# Patient Record
Sex: Female | Born: 1970 | Race: White | Hispanic: Yes | Marital: Married | Smoking: Never smoker
Health system: Southern US, Community
[De-identification: ages and names within clinical notes are randomized; demographics above are authoritative.]

## PROBLEM LIST (undated history)

## (undated) DIAGNOSIS — J3089 Other allergic rhinitis: Secondary | ICD-10-CM

## (undated) DIAGNOSIS — G43909 Migraine, unspecified, not intractable, without status migrainosus: Secondary | ICD-10-CM

## (undated) DIAGNOSIS — G44229 Chronic tension-type headache, not intractable: Secondary | ICD-10-CM

## (undated) DIAGNOSIS — R51 Headache: Secondary | ICD-10-CM

## (undated) DIAGNOSIS — M542 Cervicalgia: Secondary | ICD-10-CM

## (undated) HISTORY — DX: Other allergic rhinitis: J30.89

## (undated) HISTORY — DX: Headache: R51

## (undated) HISTORY — DX: Chronic tension-type headache, not intractable: G44.229

## (undated) HISTORY — DX: Cervicalgia: M54.2

## (undated) HISTORY — DX: Migraine, unspecified, not intractable, without status migrainosus: G43.909

---

## 2007-04-27 HISTORY — PX: APPENDECTOMY: SHX54

## 2015-04-27 DIAGNOSIS — G44229 Chronic tension-type headache, not intractable: Secondary | ICD-10-CM

## 2015-04-27 DIAGNOSIS — R519 Headache, unspecified: Secondary | ICD-10-CM

## 2015-04-27 DIAGNOSIS — M542 Cervicalgia: Secondary | ICD-10-CM

## 2015-04-27 HISTORY — DX: Chronic tension-type headache, not intractable: G44.229

## 2015-04-27 HISTORY — DX: Cervicalgia: M54.2

## 2015-04-27 HISTORY — DX: Headache, unspecified: R51.9

## 2015-08-21 ENCOUNTER — Encounter: Payer: Self-pay | Admitting: Internal Medicine

## 2015-08-21 ENCOUNTER — Ambulatory Visit (INDEPENDENT_AMBULATORY_CARE_PROVIDER_SITE_OTHER): Payer: Self-pay | Admitting: Internal Medicine

## 2015-08-21 VITALS — BP 128/70 | HR 90 | Resp 18 | Ht 60.0 in | Wt 114.0 lb

## 2015-08-21 DIAGNOSIS — J324 Chronic pansinusitis: Secondary | ICD-10-CM

## 2015-08-21 DIAGNOSIS — H6981 Other specified disorders of Eustachian tube, right ear: Secondary | ICD-10-CM

## 2015-08-21 NOTE — Patient Instructions (Addendum)
Ibuprofen 200 mg 2-4 pastillas cada 6 horas a necesita dolor. No [place] Indomethacin Neti pot una o dos veces al dia antes fluticasone o 2 horas despues [place] Fluticasonea Habla clinica si no mejor cuando completa antibiotico

## 2015-08-21 NOTE — Progress Notes (Signed)
Subjective:    Patient ID: Natalie Waller, female    DOB: 1970-10-22, 45 y.o.   MRN: [id number]  HPI   1.  Right ear popping for past 2 months.  No pain, though can have headache right frontal area and dizziness.  Sounds like she has gone to [facility] twice in past 2 months, the last 08/13/2015.   Pt. Has prescriptions filled 08/13/2015 from a Dr[facility] Kathrynn RunningPhilip Karam [facility] [address]  Indomethacin 25 mg three times daily.  Prednisone 5 mg, to take 1 tab every 6 hours for 4 days.  Was then to decrease to 2 tabs daily, then decrease to 1 tab daily for 4 days, then stop.  She only took for 5 days as she did not note an improvement in her ear or her eye symptoms.  Also has Nasal Fluticasone 1 spray each nostril, which she states she has used since 08/13/2015.  2.  Right vision blurry also over same 2 month time period. Describes pressure behind her right eye.  Was seen by [facility] Aura CampsMichael Spencer [facility] 4 days ago.  Vision and eye was fine.  Not clear if was felt to have sinusitis, but was initiated on Augmentin 500/125 mg twice daily for 10 days.  Started 08/18/2015.  Has not noted any change in symptoms since starting.    Later, patient states symptoms date back to January as she has a prescription for Meclizine for the dizziness associated with her right ear and eye symptoms. Describes spinning or balance issue, but mild.  Took for about 3 days then stopped--again back in January.  Also has Rx for Fluoxetine 10 mg capsule once daily.  First started 08/13/2015.  Took for 1 week.  Stopped because she did not feel well on the med.  Made her feel tired, lack of energy.   Current outpatient prescriptions:  .  amoxicillin-clavulanate (AUGMENTIN) 500-125 MG tablet, Take 1 tablet by mouth 2 (two) times daily., Disp: , Rfl:  .  FLUoxetine (PROZAC) 10 MG capsule, Take 10 mg by mouth daily., Disp: , Rfl:  .  fluticasone (FLONASE) 50 MCG/ACT nasal spray, Place 1 spray  into both nostrils daily., Disp: , Rfl:  .  indomethacin (INDOCIN) 25 MG capsule, Take 25 mg by mouth 3 (three) times daily with meals. Reported on 08/21/2015, Disp: , Rfl:  .  meclizine (ANTIVERT) 12.5 MG tablet, Take 12.5 mg by mouth 3 (three) times daily as needed for dizziness. Reported on 08/21/2015, Disp: , Rfl:  .  predniSONE (DELTASONE) 5 MG tablet, Take 5 mg by mouth. 4 tabs daily for 4 days, then 2 tabs daily for 4 days, then 1 tab daily for 4 days.  Only took for 5 days, starting 08/13/2015, Disp: , Rfl:    No Known Allergies  No past medical history on file.   Past Surgical History  Procedure Laterality Date  . Appendectomy  2009  . Cesarean section  1997   Family History  Problem Relation Age of Onset  . Hepatitis C Mother    Social History   Social History  . Marital Status: Single    Spouse Name: N/A  . Number of Children: 1  . Years of Education: 9   Occupational History  . unemployed      Previously worked as Education officer, environmentalcleaning in a gym   Social History Main Topics  . Smoking status: Never Smoker   . Smokeless tobacco: Never Used  . Alcohol  Use: No  . Drug Use: No  . Sexual Activity: Not Currently   Other Topics Concern  . Not on file   Social History Narrative   Originally from [place]   Came to [place]. In 2004   Lives with boyfriend    Review of Systems     Objective:   Physical Exam Appears to have mild to moderate discomfort HEENT:  PERRL, EOMI, discs sharp bilaterally, TMs dull bilaterally, right TM a bit retracted, Tender over right maxillary area.  Some dental decay, but no obvious swelling or erythema, right maxillary teeth/gum.  Posterior pharynx with mild cobbling. 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  Encouraged her to increase her Fluticasone to 2 sprays each nostril and also to start irrigation of sinuses with Neti Pot--hand out given. To finish Augmentin and if no improvement will refer through orange card to ENT.

## 2015-08-28 ENCOUNTER — Emergency Department (HOSPITAL_COMMUNITY): Payer: Self-pay

## 2015-08-28 ENCOUNTER — Telehealth: Payer: Self-pay | Admitting: Internal Medicine

## 2015-08-28 ENCOUNTER — Emergency Department (HOSPITAL_COMMUNITY)
Admission: EM | Admit: 2015-08-28 | Discharge: 2015-08-29 | Disposition: A | Discharge status: Home / Self Care | Payer: Self-pay | Attending: Emergency Medicine | Admitting: Emergency Medicine

## 2015-08-28 ENCOUNTER — Encounter (HOSPITAL_COMMUNITY): Payer: Self-pay

## 2015-08-28 DIAGNOSIS — Z79899 Other long term (current) drug therapy: Secondary | ICD-10-CM | POA: Insufficient documentation

## 2015-08-28 DIAGNOSIS — G8191 Hemiplegia, unspecified affecting right dominant side: Secondary | ICD-10-CM | POA: Insufficient documentation

## 2015-08-28 DIAGNOSIS — Z7952 Long term (current) use of systemic steroids: Secondary | ICD-10-CM | POA: Insufficient documentation

## 2015-08-28 DIAGNOSIS — Z792 Long term (current) use of antibiotics: Secondary | ICD-10-CM | POA: Insufficient documentation

## 2015-08-28 LAB — I-STAT TROPONIN, ED: Troponin i, poc: 0 ng/mL (ref 0.00–0.08)

## 2015-08-28 LAB — COMPREHENSIVE METABOLIC PANEL
ALBUMIN: 4.4 g/dL (ref 3.5–5.0)
ALK PHOS: 55 U/L (ref 38–126)
ALT: 24 U/L (ref 14–54)
AST: 23 U/L (ref 15–41)
Anion gap: 12 (ref 5–15)
BILIRUBIN TOTAL: 0.7 mg/dL (ref 0.3–1.2)
BUN: 8 mg/dL (ref 6–20)
CALCIUM: 9.8 mg/dL (ref 8.9–10.3)
CO2: 21 mmol/L — AB (ref 22–32)
CREATININE: 0.62 mg/dL (ref 0.44–1.00)
Chloride: 106 mmol/L (ref 101–111)
GFR calc Af Amer: >60 mL/min (ref >60)
GFR calc non Af Amer: >60 mL/min (ref >60)
GLUCOSE: 109 mg/dL — AB (ref 65–99)
Potassium: 3.7 mmol/L (ref 3.5–5.1)
SODIUM: 139 mmol/L (ref 135–145)
TOTAL PROTEIN: 7.7 g/dL (ref 6.5–8.1)

## 2015-08-28 LAB — DIFFERENTIAL
BASOS ABS: 0 10*3/uL (ref 0.0–0.1)
Basophils Relative: 0 %
EOS PCT: 3 %
Eosinophils Absolute: 0.2 10*3/uL (ref 0.0–0.7)
LYMPHS ABS: 2 10*3/uL (ref 0.7–4.0)
LYMPHS PCT: 26 %
Monocytes Absolute: 0.6 10*3/uL (ref 0.1–1.0)
Monocytes Relative: 8 %
NEUTROS ABS: 4.8 10*3/uL (ref 1.7–7.7)
NEUTROS PCT: 63 %

## 2015-08-28 LAB — CBC
HCT: 39.3 % (ref 36.0–46.0)
HEMOGLOBIN: 13.2 g/dL (ref 12.0–15.0)
MCH: 29.6 pg (ref 26.0–34.0)
MCHC: 33.6 g/dL (ref 30.0–36.0)
MCV: 88.1 fL (ref 78.0–100.0)
PLATELETS: 257 10*3/uL (ref 150–400)
RBC: 4.46 MIL/uL (ref 3.87–5.11)
RDW: 13.5 % (ref 11.5–15.5)
WBC: 7.6 10*3/uL (ref 4.0–10.5)

## 2015-08-28 LAB — I-STAT CHEM 8, ED
BUN: 8 mg/dL (ref 6–20)
CALCIUM ION: 1.19 mmol/L (ref 1.12–1.23)
CHLORIDE: 105 mmol/L (ref 101–111)
Creatinine, Ser: 0.5 mg/dL (ref 0.44–1.00)
GLUCOSE: 105 mg/dL — AB (ref 65–99)
HCT: 43 % (ref 36.0–46.0)
Hemoglobin: 14.6 g/dL (ref 12.0–15.0)
POTASSIUM: 3.6 mmol/L (ref 3.5–5.1)
Sodium: 141 mmol/L (ref 135–145)
TCO2: 22 mmol/L (ref 0–100)

## 2015-08-28 LAB — APTT: aPTT: 29 seconds (ref 24–37)

## 2015-08-28 LAB — PROTIME-INR
INR: 1 (ref 0.00–1.49)
PROTHROMBIN TIME: 13.4 s (ref 11.6–15.2)

## 2015-08-28 LAB — CBG MONITORING, ED: GLUCOSE-CAPILLARY: 97 mg/dL (ref 65–99)

## 2015-08-28 MED ORDER — TRAMADOL HCL 50 MG PO TABS
50.0000 mg | ORAL_TABLET | Freq: Once | ORAL | Status: AC
  Administered 2015-08-28: 50 mg via ORAL
  Filled 2015-08-28: qty 1

## 2015-08-28 NOTE — ED Notes (Signed)
Pt has glasses at home.

## 2015-08-28 NOTE — ED Provider Notes (Signed)
CSN: [id number]     Arrival date & time 08/28/15  1826 History   First MD Initiated Contact with Patient 08/28/15 2155     Chief Complaint  Patient presents with  . Facial Pain     (Consider location/radiation/quality/duration/timing/severity/associated sxs/prior Treatment) HPI   45 year old Hispanic female presents with c/o pain and tingling to R side of body.  Hx obtain through phone intepreter.  Report R eye blurry, R ear tinnitus and R side face pain and numbness that started in January.  2 weeks ago the facial numbness return along with numbness to R posterior shoulder to R elbow.  Was seen at [facility] on 4/24 and was diagnosed with otitis media and was prescribed augmentin and fluticasone.  Pt report sxs did not change despite treatment.  Blurry vision without peripheral deficits.  Pt is up-to-date with immunization.  Pt denies having weakness, decrease in strength, hearing changes, skin sensitivity, skin rash. NO fever, chills, cp, sob, cough, abd pain.    History reviewed. No pertinent past medical history. Past Surgical History  Procedure Laterality Date  . Appendectomy  2009  . Cesarean section  1997   Family History  Problem Relation Age of Onset  . Hepatitis C Mother    Social History  Substance Use Topics  . Smoking status: Never Smoker   . Smokeless tobacco: Never Used  . Alcohol Use: No   OB History    No data available     Review of Systems  All other systems reviewed and are negative.     Allergies  Review of patient's allergies indicates no known allergies.  Home Medications   Prior to Admission medications   Medication Sig Start Date End Date Taking? Authorizing Provider  amoxicillin-clavulanate (AUGMENTIN) 500-125 MG tablet Take 1 tablet by mouth 2 (two) times daily.    Historical Provider, MD  FLUoxetine (PROZAC) 10 MG capsule Take 10 mg by mouth daily.    Historical Provider, MD  fluticasone (FLONASE) 50 MCG/ACT nasal spray Place 1 spray into  both nostrils daily.    Historical Provider, MD  indomethacin (INDOCIN) 25 MG capsule Take 25 mg by mouth 3 (three) times daily with meals. Reported on 08/21/2015    Historical Provider, MD  meclizine (ANTIVERT) 12.5 MG tablet Take 12.5 mg by mouth 3 (three) times daily as needed for dizziness. Reported on 08/21/2015    Historical Provider, MD  predniSONE (DELTASONE) 5 MG tablet Take 5 mg by mouth. 4 tabs daily for 4 days, then 2 tabs daily for 4 days, then 1 tab daily for 4 days.  Only took for 5 days, starting 08/13/2015    Historical Provider, MD   BP 122/78 mmHg  Pulse 71  Temp(Src) 98.9 F (37.2 C) (Oral)  Resp 13  SpO2 99%  LMP 08/14/2015 (Approximate) Physical Exam  Constitutional: She is oriented to person, place, and time. She appears well-developed and well-nourished. No distress.  HENT:  Head: Normocephalic and atraumatic.  Right Ear: External ear normal.  Left Ear: External ear normal.  Nose: Nose normal.  Mouth/Throat: Oropharynx is clear and moist.  Subjective paresthesia to R side of face.  Mild tenderness to R temple region.    Eyes: Conjunctivae and EOM are normal.  Neck: Normal range of motion. Neck supple.  No nuchal rigidity. Neck with full range of motion.  Cardiovascular: Normal rate, regular rhythm and intact distal pulses.  Exam reveals no gallop and no friction rub.   No murmur heard. Pulmonary/Chest:  Effort normal and breath sounds normal. No respiratory distress. She has no wheezes.  Abdominal: Soft. Bowel sounds are normal. She exhibits no distension. There is no tenderness.  Neurological: She is alert and oriented to person, place, and time. She has normal reflexes.  5/5 strength to all 4 extremities. Walk with normal gait.  Subjective paresthesia to R side of face, R arm and R leg with soft touch.  Normal proprioception.  Skin: No rash noted.  Psychiatric: She has a normal mood and affect.  Nursing note and vitals reviewed.   ED Course  Procedures  (including critical care time) Labs Review Labs Reviewed  COMPREHENSIVE METABOLIC PANEL - Abnormal; Notable for the following:    CO2 21 (*)    Glucose, Bld 109 (*)    All other components within normal limits  I-STAT CHEM 8, ED - Abnormal; Notable for the following:    Glucose, Bld 105 (*)    All other components within normal limits  PROTIME-INR  APTT  CBC  DIFFERENTIAL  I-STAT TROPOININ, ED  CBG MONITORING, ED    Imaging Review Ct Head Wo Contrast  08/28/2015  CLINICAL DATA:  Right-sided body pain. Head pain for the past 4 months. Blurry vision involving the right eye. EXAM: CT HEAD WITHOUT CONTRAST TECHNIQUE: Contiguous axial images were obtained from the base of the skull through the vertex without intravenous contrast. COMPARISON:  None. FINDINGS: Brain: Gray-white differentiation is maintained. No CT evidence of acute large territory infarct. No intraparenchymal or extra-axial mass or hemorrhage. Normal size and configuration of the ventricles and basilar cisterns. No midline shift. Vascular: No hyperdense vessel or unexpected calcification. Skull: Negative for fracture or focal lesion. Sinuses/Orbits: Limited visualization of the paranasal sinuses and mastoid air cells is normal. No air-fluid levels. Limited noncontrast evaluation of the bilateral orbits and globes is normal. Other: Regional soft tissues appear normal. IMPRESSION: Negative noncontrast head CT Electronically Signed   By: Simonne ComeJohn  Watts M.D.   On: 08/28/2015 20:14   I have personally reviewed and evaluated these images and lab results as part of my medical decision-making.   EKG Interpretation None      Date: 08/28/2015  Rate: 77  Rhythm: normal sinus rhythm  QRS Axis: normal  Intervals: normal  ST/T Wave abnormalities: normal  Conduction Disutrbances: none  Narrative Interpretation:   Old EKG Reviewed: No significant changes noted     MDM   Final diagnoses:  Hemiparesis, right (HCC)    BP 112/75 mmHg   Pulse 64  Temp(Src) 98.9 F (37.2 C) (Oral)  Resp 16  SpO2 99%  LMP 08/14/2015 (Approximate)   11:50 PM Patient presents with progressive tingling and numbness sensation with pain to the right side of her face and right side of body including her right arm and right leg which has been ongoing since January. Aside from subjective paresthesia patient has no objective finding on exam. She has equal strength throughout, normal gait, and normal skin tone. A stroke workup was obtained without any acute finding. Normal head CT scan.  DDx includes spinal injury, trauma, carotid artery dissection, temporal arteritis, trigeminal neuralgia, Bells palsy, Ramsay Hunt, stroke, postherpetic neuralgia, malignancy, TIA  Due to the duration of sxs without objective finding on today's exam, plan to have pt f/u with neurology outpt for further management.  Tramadol given for her pain. Return precaution discussed.    Fayrene HelperBowie Garyn Waguespack, PA-C 08/29/15 0009  Gerhard Munchobert Lockwood, MD 08/29/15 2129

## 2015-08-28 NOTE — ED Notes (Signed)
MD at bedside. 

## 2015-08-28 NOTE — Telephone Encounter (Signed)
Patient came in today stating she finished Augmentin and there is no improvement. Patient states she has an appointment Tuesday, May 9th at 1:40 pm with Dr. Janeece RiggersSu Philomena DohenyWooi Teoh, MD [address], [place], [place] [address]  Phone: [phone] and wants to know if Dr. Delrae AlfredMulberry knows of another ENT office that can see her before Tuesday because she doesn't wan to wait. If not patient will wait until Tuesday to go to her appointment. Patient is aware of ENT through the [facility] and states she is not willing to wait a month or two. She rather do self pay to be seen as soon as possible.

## 2015-08-28 NOTE — ED Notes (Signed)
Pt is spanish speaking, triage done with interpreter phone. Pt is here with c/o severe pain and tingling to entire right side of body (head, eye, shoulder, arm and left leg.) she reports this started a few days ago. She denies numbness. No neuro deficits, strong equal hand grasps and no drift, no facial droop. She reports blurred vision to right eye and dizziness. Pt reports the pain has been ongoing since January.

## 2015-08-28 NOTE — Telephone Encounter (Signed)
Dr. Suszanne Connerseoh has a good reputation. I will not be able to get her in any faster than next Tuesday.  She should keep that appt.  Hope they can get her difficulties under control.

## 2015-08-29 MED ORDER — ACETAMINOPHEN-CODEINE #3 300-30 MG PO TABS: 1.0000 | ORAL_TABLET | Freq: Four times a day (QID) | ORAL | Status: DC | PRN

## 2015-08-29 NOTE — Telephone Encounter (Signed)
Patient informed and will keep ENT appointment.

## 2015-08-29 NOTE — Discharge Instructions (Signed)
Please call and follow up with neurologist in 1-2 weeks for further evaluation of your condition.  Take tylenol #3 as needed for pain but avoid driving or operate heavy machinery as it may cause drowsiness.  Return to the ER if you have any concerns.    Parestesia (Paresthesia) La parestesia es una sensacin de ardor o picazn fuera de lo normal que, en general, aparece en las manos, los brazos, las piernas o los pies. Sin embargo, puede Comptrollermanifestarse en cualquier parte del cuerpo. Por lo general, no es dolorosa. La sensacin puede describirse con los siguientes trminos:  Hormigueo o adormecimiento.  Pinchazos.  Algo que trepa por la piel.  Vibracin.  Extremidades que se duermen.  Picazn. La mayora de las personas sienten parestesia temporal en algn momento de la vida. La parestesia puede producirse al respirar muy rpido (hiperventilacin). Tambin puede aparecer sin causa aparente. Es comn que haya parestesia al hacer presin sobre un nervio, pero la sensacin desaparece rpidamente al quitar la presin. Sin embargo, para Engineering geologistalgunas personas la parestesia es un problema de larga duracin (crnico) causado por un trastorno preexistente. Si sigue teniendo parestesia, es posible que necesite evaluacin mdica adicional. INSTRUCCIONES PARA EL CUIDADO EN EL HOGAR Controle su afeccin para ver si hay cambios. Las siguientes medidas pueden servir para Psychologist, educationalaliviar cualquier molestia que sienta:  Evite el consumo de alcohol.  Pruebe con masajes o acupuntura como ayuda para Eastman Kodakaliviar los sntomas.  Concurra a todas las visitas de control como se lo haya indicado el mdico. Esto es importante. SOLICITE ATENCIN MDICA SI:  Contina teniendo episodios de parestesia.  La sensacin de ardor o picazn empeora al caminar.  Tiene dolor, calambres o mareos.  Le aparece una erupcin cutnea. SOLICITE ATENCIN MDICA DE INMEDIATO SI:  Se siente dbil.  Tiene dificultad para caminar o  moverse.  Tiene problemas con el habla, la comprensin o la visin.  Se siente confundido.  No puede controlar la vejiga o los intestinos.  Siente adormecimiento despus de una lesin.  Se desmaya.   Esta informacin no tiene Theme park managercomo fin reemplazar el consejo del mdico. Asegrese de hacerle al mdico cualquier pregunta que tenga.   Document Released: 07/29/2008 Document Revised: 08/27/2014 Elsevier Interactive Patient Education [employer]2016 Elsevier Inc.

## 2015-09-02 ENCOUNTER — Ambulatory Visit (INDEPENDENT_AMBULATORY_CARE_PROVIDER_SITE_OTHER): Payer: Self-pay | Admitting: Neurology

## 2015-09-02 ENCOUNTER — Encounter: Payer: Self-pay | Admitting: Neurology

## 2015-09-02 VITALS — BP 104/76 | HR 58 | Ht 60.0 in | Wt 113.0 lb

## 2015-09-02 DIAGNOSIS — H539 Unspecified visual disturbance: Secondary | ICD-10-CM

## 2015-09-02 DIAGNOSIS — R202 Paresthesia of skin: Secondary | ICD-10-CM

## 2015-09-02 NOTE — Progress Notes (Signed)
Reason for visit: Paresthesias  Referring physician: [place]  [name] is a 45 y.o. female  History of present illness:  Natalie Waller is a 45 year old right-handed Hispanic female with onset of paresthesias involving the right face and her right arm from the shoulder down to the elbow. The patient denies any weakness of the arm or the legs or the face. The patient has noted some dull achy pain around the right eye, this is associated with some blurring of the right eye only. The vision from the left eye is fully clear. The patient reports some dizziness at times, she has been placed on meclizine. The patient denies any other type of headache pain. She denies any gait disturbance or difficulty with balance. She denies any issues controlling the bowels or the bladder. The episodes of paresthesias tend to come and go, but they are becoming more frequent over time, and are daily in nature. The patient has an occasional popping sensation in her right ear. She may have some ringing in the ears. She denies any significant neck pain or back pain. She was seen through the emergency room and a CT scan of the brain was done and was unremarkable, she is sent to this office for an evaluation.  History reviewed. No pertinent past medical history.  Past Surgical History  Procedure Laterality Date  . Appendectomy  2009  . Cesarean section  1997    Family History  Problem Relation Age of Onset  . Hepatitis C Mother     Social history:  reports that she has never smoked. She has never used smokeless tobacco. She reports that she does not drink alcohol or use illicit drugs.  Medications:  Prior to Admission medications   Medication Sig Start Date End Date Taking? Authorizing Provider  acetaminophen-codeine (TYLENOL #3) 300-30 MG tablet Take 1-2 tablets by mouth every 6 (six) hours as needed for moderate pain. 08/29/15   Fayrene HelperBowie Tran, PA-C  amoxicillin-clavulanate (AUGMENTIN) 500-125 MG tablet  Take 1 tablet by mouth 2 (two) times daily.    Historical Provider, MD  FLUoxetine (PROZAC) 10 MG capsule Take 10 mg by mouth daily.    Historical Provider, MD  fluticasone (FLONASE) 50 MCG/ACT nasal spray Place 1 spray into both nostrils daily.    Historical Provider, MD  indomethacin (INDOCIN) 25 MG capsule Take 25 mg by mouth 3 (three) times daily with meals. Reported on 08/21/2015    Historical Provider, MD  meclizine (ANTIVERT) 12.5 MG tablet Take 12.5 mg by mouth 3 (three) times daily as needed for dizziness. Reported on 08/21/2015    Historical Provider, MD  predniSONE (DELTASONE) 5 MG tablet Take 5 mg by mouth. 4 tabs daily for 4 days, then 2 tabs daily for 4 days, then 1 tab daily for 4 days.  Only took for 5 days, starting 08/13/2015    Historical Provider, MD     No Known Allergies  ROS:  Out of a complete 14 system review of symptoms, the patient complains only of the following symptoms, and all other reviewed systems are negative.  Weight loss Hearing loss, ringing in the ears Blurred vision, ear pain Shortness of breath Increased thirst Headache, numbness, weakness, dizziness Change in appetite Sleepiness  Blood pressure 104/76, pulse 58, height 5' (1.524 m), weight 113 lb (51.256 kg), last menstrual period 08/14/2015.  Physical Exam  General: The patient is alert and cooperative at the time of the examination.  Eyes: Pupils are equal, round, and  reactive to light. Discs are flat bilaterally. Good venous pulsations are seen.  Neck: The neck is supple, no carotid bruits are noted.  Respiratory: The respiratory examination is clear.  Cardiovascular: The cardiovascular examination reveals a regular rate and rhythm, no obvious murmurs or rubs are noted.  Skin: Extremities are without significant edema.  Neurologic Exam  Mental status: The patient is alert and oriented x 3 at the time of the examination. The patient has apparent normal recent and remote memory, with an  apparently normal attention span and concentration ability.  Cranial nerves: Facial symmetry is present. There is good sensation of the face to pinprick and soft touch bilaterally. The strength of the facial muscles and the muscles to head turning and shoulder shrug are normal bilaterally. Speech is well enunciated, no aphasia or dysarthria is noted. Extraocular movements are full. Visual fields are full. The tongue is midline, and the patient has symmetric elevation of the soft palate. No obvious hearing deficits are noted.  Motor: The motor testing reveals 5 over 5 strength of all 4 extremities. Good symmetric motor tone is noted throughout.  Sensory: Sensory testing is intact to pinprick, soft touch, vibration sensation, and position sense on all 4 extremities. No evidence of extinction is noted.  Coordination: Cerebellar testing reveals good finger-nose-finger and heel-to-shin bilaterally.  Gait and station: Gait is normal. Tandem gait is normal. Romberg is negative. No drift is seen.  Reflexes: Deep tendon reflexes are symmetric and normal bilaterally. Toes are downgoing bilaterally.   CT head 08/28/15:  IMPRESSION: Negative noncontrast head CT  * CT scan images were reviewed online. I agree with the written report.    Assessment/Plan:  1. Paresthesias, right face and right arm  2. Blurred vision right eye, right eye discomfort  The patient has an objectively normal examination. She reports subjective sensory alterations on the right face and right upper extremity associated with some visual complaints with the right eye, and some pain around the right eye. The CT of the head is unremarkable, but the patient will need to be evaluated for possible demyelinating disease. She will be sent for MRI evaluation of the brain with and without gadolinium enhancement, some blood work will be done today. She will follow-up in 4 months, sooner if needed.  Marlan Palau MD 09/02/2015 2:02  PM  [facility] [address] [place], [place] [address]  Phone [phone] Fax [phone]

## 2015-09-02 NOTE — Patient Instructions (Signed)
Parestesia (Paresthesia) La parestesia es una sensacin de ardor o picazn que puede aparecer en cualquier parte del cuerpo. Suele Wells Fargomanifestarse en las manos, los brazos, las piernas o los pies. Por lo general, no es dolorosa. En la International Business Machinesmayora de los casos, la sensacin desaparece al poco tiempo y no es un signo de un problema grave. CUIDADOS EN EL HOGAR  Evite el consumo de alcohol.  Pruebe con masajes o acupuntura para Technical sales engineeraliviar la sensacin de Dentistmalestar.  Concurra a todas las visitas de control como se lo haya indicado el mdico. Esto es importante. SOLICITE AYUDA SI:  Contina teniendo episodios de parestesia.  La sensacin de ardor o picazn empeora al caminar.  Siente dolor o tiene calambres.  Siente mareos.  Tiene una erupcin cutnea. SOLICITE AYUDA DE INMEDIATO SI:  Se siente dbil.  Tiene dificultad para caminar o moverse.  Tiene problemas para hablar, comprender o ver.  Se siente confundido.  No puede controlar la orina (miccin) ni la evacuacin de la materia fecal (defecacin).  Pierde la sensibilidad (adormecimiento) despus de una lesin.  Pierde el conocimiento (se desmaya).   Esta informacin no tiene Theme park managercomo fin reemplazar el consejo del mdico. Asegrese de hacerle al mdico cualquier pregunta que tenga.   Document Released: 05/15/2010 Document Revised: 08/27/2014 Elsevier Interactive Patient Education [employer]2016 Elsevier Inc.

## 2015-09-03 ENCOUNTER — Telehealth: Payer: Self-pay | Admitting: Neurology

## 2015-09-03 LAB — COMPREHENSIVE METABOLIC PANEL
ALBUMIN: 4.8 g/dL (ref 3.5–5.5)
ALT: 76 IU/L — ABNORMAL HIGH (ref 0–32)
AST: 134 IU/L — ABNORMAL HIGH (ref 0–40)
Albumin/Globulin Ratio: 1.7 (ref 1.2–2.2)
Alkaline Phosphatase: 71 IU/L (ref 39–117)
BUN / CREAT RATIO: 9 (ref 9–23)
BUN: 7 mg/dL (ref 6–24)
Bilirubin Total: 0.6 mg/dL (ref 0.0–1.2)
CALCIUM: 9.9 mg/dL (ref 8.7–10.2)
CO2: 24 mmol/L (ref 18–29)
CREATININE: 0.76 mg/dL (ref 0.57–1.00)
Chloride: 99 mmol/L (ref 96–106)
GFR calc non Af Amer: 95 mL/min/{1.73_m2} (ref >59)
GFR, EST AFRICAN AMERICAN: 110 mL/min/{1.73_m2} (ref >59)
GLUCOSE: 112 mg/dL — AB (ref 65–99)
Globulin, Total: 2.8 g/dL (ref 1.5–4.5)
Potassium: 3.8 mmol/L (ref 3.5–5.2)
Sodium: 140 mmol/L (ref 134–144)
TOTAL PROTEIN: 7.6 g/dL (ref 6.0–8.5)

## 2015-09-03 LAB — B. BURGDORFI ANTIBODIES

## 2015-09-03 LAB — VITAMIN B12: Vitamin B-12: >2000 pg/mL — ABNORMAL HIGH (ref 211–946)

## 2015-09-03 LAB — ANA W/REFLEX: Anti Nuclear Antibody(ANA): NEGATIVE

## 2015-09-03 LAB — RHEUMATOID FACTOR

## 2015-09-03 LAB — SEDIMENTATION RATE: SED RATE: 7 mm/h (ref 0–32)

## 2015-09-03 LAB — HIV ANTIBODY (ROUTINE TESTING W REFLEX): HIV SCREEN 4TH GENERATION: NONREACTIVE

## 2015-09-03 LAB — RPR: RPR Ser Ql: NONREACTIVE

## 2015-09-03 LAB — ANGIOTENSIN CONVERTING ENZYME: ANGIO CONVERT ENZYME: 32 U/L (ref 14–82)

## 2015-09-03 NOTE — Telephone Encounter (Signed)
I called, left a message. The work is unremarkable with exception that the liver enzymes are elevated. This will need to be rechecked in the future, looking for other etiologies of liver dysfunction. I will send a report to the primary care physician.

## 2015-09-06 ENCOUNTER — Encounter (HOSPITAL_COMMUNITY): Payer: Self-pay | Admitting: Vascular Surgery

## 2015-09-06 ENCOUNTER — Emergency Department (HOSPITAL_COMMUNITY)
Admission: EM | Admit: 2015-09-06 | Discharge: 2015-09-06 | Disposition: A | Discharge status: Home / Self Care | Payer: Self-pay | Attending: Emergency Medicine | Admitting: Emergency Medicine

## 2015-09-06 DIAGNOSIS — Z79899 Other long term (current) drug therapy: Secondary | ICD-10-CM | POA: Insufficient documentation

## 2015-09-06 DIAGNOSIS — Z7951 Long term (current) use of inhaled steroids: Secondary | ICD-10-CM | POA: Insufficient documentation

## 2015-09-06 DIAGNOSIS — H5711 Ocular pain, right eye: Secondary | ICD-10-CM | POA: Insufficient documentation

## 2015-09-06 DIAGNOSIS — H538 Other visual disturbances: Secondary | ICD-10-CM | POA: Insufficient documentation

## 2015-09-06 MED ORDER — PROPARACAINE HCL 0.5 % OP SOLN
1.0000 [drp] | Freq: Once | OPHTHALMIC | Status: AC
  Administered 2015-09-06: 1 [drp] via OPHTHALMIC
  Filled 2015-09-06: qty 15

## 2015-09-06 MED ORDER — FLUORESCEIN SODIUM 1 MG OP STRP
1.0000 | ORAL_STRIP | Freq: Once | OPHTHALMIC | Status: AC
  Administered 2015-09-06: 1 via OPHTHALMIC
  Filled 2015-09-06: qty 1

## 2015-09-06 NOTE — ED Notes (Signed)
Pt reports to the ED for eval of right eye pain x 1 year. Has been seen by ENT doctor, neurologist, and opthamologist. She is currently on medication for it but states the medication is not helping her pain. She has some blurred vision in that eye but denies any loss of vision or diplopia. Pt A&Ox4, resp e/u, and skin warm and dry. Language line used for interpretation.

## 2015-09-06 NOTE — Discharge Instructions (Signed)
Dolor sin causa aparente °(Pain Without a Known Cause) °¿QUÉ ES EL DOLOR SIN CAUSA APARENTE? °El dolor puede presentarse en cualquier parte del cuerpo y puede ser de leve a grave. En algunos casos, las causas del dolor se desconocen. Algunos tipos de dolor que pueden ocurrir sin causa aparente incluyen los siguientes:  °· Dolor de cabeza. °· Dolor de espalda. °· Dolor abdominal. °· Dolor en el cuello. °¿CÓMO SE DIAGNOSTICA EL DOLOR SIN CAUSA APARENTE?  °Su médico tratará de determinar la causa del dolor. Para esto, realizará lo siguiente: °· Examen físico. °· Historia clínica. °· Análisis de sangre. °· Análisis de orina. °· Radiografías. °Si no se descubre ninguna causa, el médico puede diagnosticarle "dolor sin causa aparente".  °¿HAY TRATAMIENTO PARA EL DOLOR SIN CAUSA APARENTE?  °El tratamiento depende del tipo de dolor que tenga. El médico puede recetar medicamentos para ayudar a calmar el dolor.  °¿QUÉ PUEDO HACER EN MI CASA PARA CONTROLAR EL DOLOR?  °· Tome los medicamentos solamente como se lo haya indicado el médico. °· Interrumpa las actividades que le causen dolor. En los momentos que sienta dolor intenso, haga reposo en cama. °· Trate de reducir el estrés realizando actividades, como yoga o meditación. Hable con su médico para que le dé otras recomendaciones sobre cómo reducir el estrés con la actividad física. °· Haga ejercicio regularmente si el médico lo autoriza. °· Siga una dieta saludable que incluya frutas y verduras. Esto puede mejorar el dolor. Hable con el médico si tiene alguna pregunta sobre la dieta. °¿QUÉ PASA SI EL DOLOR NO MEJORA?  °Si tiene mucho dolor y no se encuentran los motivos de dicho dolor o este empeora, es importante que realice un seguimiento con su médico. Puede ser necesario repetir las pruebas y realizar estudios más profundos para hallar la posible causa.  °  °Esta información no tiene como fin reemplazar el consejo del médico. Asegúrese de hacerle al médico cualquier  pregunta que tenga. °  °Document Released: 01/20/2005 Document Revised: 05/03/2014 °Elsevier Interactive Patient Education ©2016 Elsevier Inc. ° °

## 2015-09-06 NOTE — ED Provider Notes (Signed)
CSN: [id number]     Arrival date & time 09/06/15  1412 History  By signing my name below, I, Tanda RockersMargaux Venter, attest that this documentation has been prepared under the direction and in the presence of Everlene FarrierWilliam Barbarita Hutmacher, PA-C. Electronically Signed: Tanda RockersMargaux Venter, ED Scribe. 09/06/2015. 2:31 PM.   Chief Complaint  Patient presents with  . Eye Pain   The history is provided by the patient. The history is limited by a language barrier. A language interpreter was used.    HPI Comments: Natalie Waller is a 45 y.o. female who presents to the Emergency Department complaining of gradual onset, constant, right eye pain that began in January 2017 (approximately 5 months ago). No known injury to the eye. Pt also complains of mild blurry vision. She has already followed up with an opthalmologist, neurologist, and ENT. She states that the ENT told her that it was her nerve causing the pain. Pt was prescribed 40 mg Propanolol 1 week ago by Dr. Suszanne Connerseoh without relief. She has not contacted Dr. Suszanne Connerseoh to discuss the medications not working. She denies the pain change to her symptoms in anyway since onset but reported that none of the other doctors can tell her what is wrong, prompting her to come to the ED today. She wears glasses, but not contacts. Denies double vision, black spots to vision, eye drainage, ear pain, or any other associated symptoms. Pt does not wear contacts.   History reviewed. No pertinent past medical history. Past Surgical History  Procedure Laterality Date  . Appendectomy  2009  . Cesarean section  1997   Family History  Problem Relation Age of Onset  . Hepatitis C Mother    Social History  Substance Use Topics  . Smoking status: Never Smoker   . Smokeless tobacco: Never Used  . Alcohol Use: No   OB History    No data available     Review of Systems  Constitutional: Negative for fever.  HENT: Negative for ear pain and sore throat.   Eyes: Positive for pain and visual  disturbance. Negative for photophobia, discharge, redness and itching.  Respiratory: Negative for cough.   Skin: Negative for rash.   Allergies  Review of patient's allergies indicates no known allergies.  Home Medications   Prior to Admission medications   Medication Sig Start Date End Date Taking? Authorizing Provider  acetaminophen-codeine (TYLENOL #3) 300-30 MG tablet Take 1-2 tablets by mouth every 6 (six) hours as needed for moderate pain. 08/29/15   Fayrene HelperBowie Tran, PA-C  FLUoxetine (PROZAC) 10 MG capsule Take 10 mg by mouth daily. Reported on 09/02/2015    Historical Provider, MD  fluticasone (FLONASE) 50 MCG/ACT nasal spray Place 1 spray into both nostrils daily. Reported on 09/02/2015    Historical Provider, MD  indomethacin (INDOCIN) 25 MG capsule Take 25 mg by mouth 3 (three) times daily with meals. Reported on 09/02/2015    Historical Provider, MD  meclizine (ANTIVERT) 12.5 MG tablet Take 12.5 mg by mouth 3 (three) times daily as needed for dizziness. Reported on 09/02/2015    Historical Provider, MD   BP 120/67 mmHg  Pulse 62  Temp(Src) 98.3 F (36.8 C) (Oral)  Resp 18  SpO2 99%  LMP 08/14/2015 (Approximate)   Physical Exam  Constitutional: She is oriented to person, place, and time. She appears well-developed and well-nourished. No distress.  HENT:  Head: Normocephalic and atraumatic.  Right Ear: External ear normal.  Left Ear: External ear normal.  Mouth/Throat: Oropharynx is  clear and moist.  Eyes: Conjunctivae and EOM are normal. Pupils are equal, round, and reactive to light. Right eye exhibits no discharge. Left eye exhibits no discharge.  Slit lamp exam:      The right eye shows no corneal abrasion, no foreign body and no fluorescein uptake.  Right eye was anesthetized with proparacaine and stained with fluorescein. No corneal abrasions or ulcers noted. No foreign bodies. EOMs are intact bilaterally. No eyelid edema or erythema. Eye pressures were checked with Tono-Pen  bilaterally. Right eye pressure: 18, 20, and 19. Left eye pressure: 19, 21, and 20.  Neck: Neck supple.  Cardiovascular: Normal rate, regular rhythm, normal heart sounds and intact distal pulses.   Pulmonary/Chest: Effort normal and breath sounds normal. No respiratory distress.  Abdominal: Soft. There is no tenderness.  Lymphadenopathy:    She has no cervical adenopathy.  Neurological: She is alert and oriented to person, place, and time. No cranial nerve deficit. Coordination normal.  Skin: Skin is warm and dry. No rash noted. She is not diaphoretic. No erythema. No pallor.  Psychiatric: She has a normal mood and affect. Her behavior is normal.  Nursing note and vitals reviewed.   ED Course  Procedures (including critical care time)  DIAGNOSTIC STUDIES: Oxygen Saturation is 99% on RA, normal by my interpretation.    COORDINATION OF CARE: 2:31 PM-Discussed treatment plan which includes fluorescein strip test with pt at bedside and pt agreed to plan.     Visual Acuity  Right Eye Distance: 10/40 (Used "E Chart" /  Left Eye Distance: 10/20 (Used "E Chart" /  Bilateral Distance: 10/20 (Used "E Chart" /  Right Eye Near:   Left Eye Near:    Bilateral Near:      MDM   Final diagnoses:  Eye pain, right   This  is a 45 y.o. female who presents to the Emergency Department complaining of gradual onset, constant, right eye pain that began in January 2017 (approximately 5 months ago). No known injury to the eye. Pt also complains of mild blurry vision. She has already followed up with an opthalmologist, neurologist, and ENT. She states that the ENT told her that it was her nerve causing the pain. Pt was prescribed 40 mg Propanolol 1 week ago by Dr. Suszanne Conners without relief. She has not contacted Dr. Suszanne Conners to discuss the medications not working. She denies the pain change to her symptoms in anyway since onset but reported that none of the other doctors can tell her what is wrong, prompting her  to come to the ED today. She wears glasses, but not contacts. Denies double vision, black spots to vision, eye drainage On exam the patient is afebrile and nontoxic appearing. On chart review I see the patient was seen 4 days ago by neurology. She had an unremarkable workup. She had an unremarkable CT scan of her head. Plan based on neurology was to have an MRI. This is scheduled. Vision is grossly intact. Patient's right eye was anesthetized with proparacaine and stained with fluorescein. No fluorescein uptake. No corneal ulcers or abrasions. No foreign bodies. Pressures were checked and her bilateral eyes and are non-concerning. EOMs are intact. No concern for ophthalmic emergency at this time. We'll have her follow-up with her ENT doctor, neurologist and ophthalmologist. I encouraged her to call and tell her providers that she is continuing to have some pain. I discussed return precautions. I advised the patient to follow-up with their primary care provider this week. I advised  the patient to return to the emergency department with new or worsening symptoms or new concerns. The patient verbalized understanding and agreement with plan.    I personally performed the services described in this documentation, which was scribed in my presence. The recorded information has been reviewed and is accurate.          Everlene Farrier, PA-C 09/06/15 1517  Loren Racer, MD 09/07/15 [phone]

## 2015-09-18 ENCOUNTER — Ambulatory Visit
Admission: RE | Admit: 2015-09-18 | Discharge: 2015-09-18 | Disposition: A | Discharge status: Home / Self Care | Payer: No Typology Code available for payment source | Source: Ambulatory Visit | Attending: Neurology | Admitting: Neurology

## 2015-09-18 DIAGNOSIS — R202 Paresthesia of skin: Secondary | ICD-10-CM

## 2015-09-18 DIAGNOSIS — H539 Unspecified visual disturbance: Secondary | ICD-10-CM

## 2015-09-18 MED ORDER — GADOBENATE DIMEGLUMINE 529 MG/ML IV SOLN
10.0000 mL | Freq: Once | INTRAVENOUS | Status: AC | PRN
  Administered 2015-09-18: 10 mL via INTRAVENOUS

## 2015-09-19 ENCOUNTER — Ambulatory Visit: Payer: No Typology Code available for payment source | Admitting: Internal Medicine

## 2015-09-19 ENCOUNTER — Telehealth: Payer: Self-pay | Admitting: Neurology

## 2015-09-19 NOTE — Telephone Encounter (Signed)
  I called the patient. The MRI of the brain is almost normal. Minimal WM changes. LFTs are elevated ? Cause.  MRI brain 09/19/15:  IMPRESSION: Abnormal MRI scan of the brain showing tiny nonspecific periventricular and subcortical white matter hyperintensities with differential discussed above. No enhancing lesions are noted.

## 2015-10-07 ENCOUNTER — Encounter: Payer: Self-pay | Admitting: Adult Health

## 2015-10-07 ENCOUNTER — Ambulatory Visit (INDEPENDENT_AMBULATORY_CARE_PROVIDER_SITE_OTHER): Payer: Self-pay | Admitting: Adult Health

## 2015-10-07 VITALS — BP 124/70 | HR 86 | Resp 14 | Ht 60.0 in | Wt 115.2 lb

## 2015-10-07 DIAGNOSIS — R748 Abnormal levels of other serum enzymes: Secondary | ICD-10-CM

## 2015-10-07 DIAGNOSIS — H5713 Ocular pain, bilateral: Secondary | ICD-10-CM

## 2015-10-07 DIAGNOSIS — R202 Paresthesia of skin: Secondary | ICD-10-CM

## 2015-10-07 NOTE — Progress Notes (Signed)
PATIENT: Natalie ParesShirley Diaz Waller DOB: 25-Oct-1970  REASON FOR VISIT: follow up- paresthesias HISTORY FROM: patient- interpreter is present  HISTORY OF PRESENT ILLNESS: Natalie Waller is a 45 year old female with a history of paresthesias involving the right face and right arm. She returns today for follow-up. She reports that she continues to have eye pain. She states that it was on the right but now its worst on the left. She states it feels as if "something is walking across her eye." She denies any pain but does report some pressure sensation/ "stress" in the right temporal region. This sensation is not constant. She states that it comes and goes. She has noticed in the last 2 weeks she feels that it is getting slightly worse. She does report some blurry Vision. Denies diplopia. She continues to have numbness and tingling in the right arm from the elbow to the shoulder. Denies any weakness, numbness or tingling in the legs. Denies any changes with the bowels or bladder. She has seen Dr. Naoma DienerKoala and her eye exam was normal. She does say that this all started with a popping sensation in the ear and then she passed out. She does report that she has seen a ENT physician on [address] She denies being out of the country. The patient did have an MRI of the brain which did show some nonspecific changes. Her lab work was unremarkable with the exception of elevated liver enzymes She returns today for an evaluation.   HISTORY 09/02/15 [facility]): Natalie Waller is a 45 year old right-handed Hispanic female with onset of paresthesias involving the right face and her right arm from the shoulder down to the elbow. The patient denies any weakness of the arm or the legs or the face. The patient has noted some dull achy pain around the right eye, this is associated with some blurring of the right eye only. The vision from the left eye is fully clear. The patient reports some dizziness at times, she has been placed on  meclizine. The patient denies any other type of headache pain. She denies any gait disturbance or difficulty with balance. She denies any issues controlling the bowels or the bladder. The episodes of paresthesias tend to come and go, but they are becoming more frequent over time, and are daily in nature. The patient has an occasional popping sensation in her right ear. She may have some ringing in the ears. She denies any significant neck pain or back pain. She was seen through the emergency room and a CT scan of the brain was done and was unremarkable, she is sent to this office for an evaluation.  REVIEW OF SYSTEMS: Out of a complete 14 system review of symptoms, the patient complains only of the following symptoms, and all other reviewed systems are negative.  Eye itching, light activity, eye pain, blurred vision, depression, neck stiffness  ALLERGIES: No Known Allergies  HOME MEDICATIONS: Outpatient Prescriptions Prior to Visit  Medication Sig Dispense Refill  . acetaminophen-codeine (TYLENOL #3) 300-30 MG tablet Take 1-2 tablets by mouth every 6 (six) hours as needed for moderate pain. (Patient not taking: Reported on 10/07/2015) 15 tablet 0  . FLUoxetine (PROZAC) 10 MG capsule Take 10 mg by mouth daily. Reported on 10/07/2015    . fluticasone (FLONASE) 50 MCG/ACT nasal spray Place 1 spray into both nostrils daily. Reported on 10/07/2015    . indomethacin (INDOCIN) 25 MG capsule Take 25 mg by mouth 3 (three) times daily with meals.  Reported on 10/07/2015    . meclizine (ANTIVERT) 12.5 MG tablet Take 12.5 mg by mouth 3 (three) times daily as needed for dizziness. Reported on 10/07/2015     No facility-administered medications prior to visit.    PAST MEDICAL HISTORY: No past medical history on file.  PAST SURGICAL HISTORY: Past Surgical History  Procedure Laterality Date  . Appendectomy  2009  . Cesarean section  1997    FAMILY HISTORY: Family History  Problem Relation Age of Onset  .  Hepatitis C Mother     SOCIAL HISTORY: Social History   Social History  . Marital Status: Single    Spouse Name: N/A  . Number of Children: 1  . Years of Education: 9   Occupational History  . unemployed      Previously worked as Education officer, environmental in a gym   Social History Main Topics  . Smoking status: Never Smoker   . Smokeless tobacco: Never Used  . Alcohol Use: No  . Drug Use: No  . Sexual Activity: Not Currently   Other Topics Concern  . Not on file   Social History Narrative   Originally from [place]   Came to [place]. In 2004   Lives with boyfriend   Right-handed   No caffeine use      PHYSICAL EXAM  Filed Vitals:   10/07/15 0909  BP: 124/70  Pulse: 86  Resp: 14  Height: 5' (1.524 m)  Weight: 115 lb 3.2 oz (52.254 kg)   Body mass index is 22.5 kg/(m^2).  Generalized: Well developed, in no acute distress  HEENT: Conjunctiva is clear. No erythema noted in the eyes bilaterally.  Neurological examination  Mentation: Alert oriented to time, place, history taking. Follows all commands speech and language fluent Cranial nerve II-XII: Pupils were equal round reactive to light. Extraocular movements were full, visual field were full on confrontational test. Facial sensation and strength were normal. Uvula tongue midline. Head turning and shoulder shrug  were normal and symmetric. Motor: The motor testing reveals 5 over 5 strength of all 4 extremities. Good symmetric motor tone is noted throughout.  Sensory: Sensory testing is intact to soft touch on all 4 extremities. No evidence of extinction is noted.  Coordination: Cerebellar testing reveals good finger-nose-finger and heel-to-shin bilaterally.  Gait and station: Gait is normal. Tandem gait is normal. Romberg is negative. No drift is seen.  Reflexes: Deep tendon reflexes are symmetric and normal bilaterally.   DIAGNOSTIC DATA (LABS, IMAGING, TESTING) - I reviewed patient records, labs, notes, testing and imaging myself  where available.  Lab Results  Component Value Date   WBC 7.6 08/28/2015   HGB 14.6 08/28/2015   HCT 43.0 08/28/2015   MCV 88.1 08/28/2015   PLT 257 08/28/2015      Component Value Date/Time   NA 140 09/02/2015 1114   NA 141 08/28/2015 1911   K 3.8 09/02/2015 1114   CL 99 09/02/2015 1114   CO2 24 09/02/2015 1114   GLUCOSE 112* 09/02/2015 1114   GLUCOSE 105* 08/28/2015 1911   BUN 7 09/02/2015 1114   BUN 8 08/28/2015 1911   CREATININE 0.76 09/02/2015 1114   CALCIUM 9.9 09/02/2015 1114   PROT 7.6 09/02/2015 1114   PROT 7.7 08/28/2015 1903   ALBUMIN 4.8 09/02/2015 1114   ALBUMIN 4.4 08/28/2015 1903   AST 134* 09/02/2015 1114   ALT 76* 09/02/2015 1114   ALKPHOS 71 09/02/2015 1114   BILITOT 0.6 09/02/2015 1114   BILITOT 0.7 08/28/2015  1903   GFRNONAA 95 09/02/2015 1114   GFRAA 110 09/02/2015 1114    Lab Results  Component Value Date   VITAMINB12 >2000* 09/02/2015       ASSESSMENT AND PLAN 45 y.o. year old female  has no past medical history on file. here with:  1. Paresthesia 2. Ocular discomfort bilateral 3. Elevated liver enzymes  The patient's workup has been relatively unremarkable. She did have elevated liver enzymes- we will recheck blood work today. Her MRI was relatively unremarkable with the exception of some nonspecific changes. I consulted with Dr. Lucia Gaskins. Not sure that her eye discomfort is from a neurological origin. Her ophthalmologist thought this possibly be trigeminal neuralgia? However today the patient complains of discomfort in both eyes as well as numbness and tingling in the right arm. We could do MRIs of the orbits however I would suggest the patient follow-up with Dr.Koala and get his opinion before proceeding with this test. I did suggest  nerve conduction studies with EMG of the right arm due to burning and tingling. Patient deferred at this time. She will follow-up with Dr. Anne Hahn in 4-5 months.  I spent 25 minutes with the patient 50% of this  time was spent counseling the patient on treatment plan.    Butch Penny, MSN, NP-C 10/07/2015, 9:11 AM [facility] [address] [place], [place] [address] [phone]

## 2015-10-08 ENCOUNTER — Telehealth: Payer: Self-pay | Admitting: *Deleted

## 2015-10-08 LAB — HEPATIC FUNCTION PANEL
ALK PHOS: 63 IU/L (ref 39–117)
ALT: 29 IU/L (ref 0–32)
AST: 30 IU/L (ref 0–40)
Albumin: 4.7 g/dL (ref 3.5–5.5)
BILIRUBIN TOTAL: 0.4 mg/dL (ref 0.0–1.2)
BILIRUBIN, DIRECT: 0.11 mg/dL (ref 0.00–0.40)
TOTAL PROTEIN: 7.5 g/dL (ref 6.0–8.5)

## 2015-10-08 NOTE — Telephone Encounter (Signed)
I spoke to pt and relayed that her labwork was normal.  She verbalized understanding.  

## 2015-10-08 NOTE — Telephone Encounter (Signed)
-----   Message from Butch PennyMegan Millikan, NP sent at 10/08/2015  9:43 AM EDT ----- Lab work is normal. Please call the patient. Thanks.

## 2015-10-17 ENCOUNTER — Ambulatory Visit (INDEPENDENT_AMBULATORY_CARE_PROVIDER_SITE_OTHER): Payer: Self-pay | Admitting: Internal Medicine

## 2015-10-17 ENCOUNTER — Encounter: Payer: Self-pay | Admitting: Internal Medicine

## 2015-10-17 ENCOUNTER — Other Ambulatory Visit: Payer: Self-pay

## 2015-10-17 VITALS — BP 110/60 | HR 72 | Resp 15 | Ht 60.0 in | Wt 112.0 lb

## 2015-10-17 DIAGNOSIS — R51 Headache: Principal | ICD-10-CM

## 2015-10-17 DIAGNOSIS — R519 Headache, unspecified: Secondary | ICD-10-CM

## 2015-10-17 MED ORDER — VERAPAMIL HCL ER 120 MG PO TBCR: 120.0000 mg | EXTENDED_RELEASE_TABLET | Freq: Every day | ORAL | Status: DC

## 2015-10-17 MED ORDER — VERAPAMIL HCL ER 120 MG PO CP24: ORAL_CAPSULE | ORAL | Status: DC

## 2015-10-17 MED ORDER — VERAPAMIL HCL ER 120 MG PO CP24
ORAL_CAPSULE | ORAL | Status: DC
Start: 2015-10-17 — End: 2016-12-18

## 2015-10-17 MED ORDER — PROPRANOLOL HCL 40 MG PO TABS: ORAL_TABLET | ORAL | Status: DC

## 2015-10-17 NOTE — Progress Notes (Addendum)
   Subjective:    Patient ID: Natalie Waller, female    DOB: December 25, 1970, 45 y.o.   MRN: [id number]  HPI  1.  Right head and eye pain:  Has been going on since January.  Started with URI type symptoms, has been treated for possible sinusitis, evaluated with MRI and CT of head/brain, the former showing some diffuse periventricular and white matter hyperintensities and the latter not supportive of abnormality, including sinusitis. She has been evaluated by Neurology, who could not define a cause of her pain, possibly Trigeminal Neuralgia, and was told to [facility].  Discussed having nerve conduction studies, but pt. Preferred to hold on that. Dr. Karleen HampshireSpencer is now referring her to another eye doctor--?Dr. Kai LevinsMichel? Sounds like a neuro ophthalmologist.   Pt. Now states feels like she has a lot of pressure around her eyes and head--radiates up and to nuchal area--started around the right eye and now on both sides and states her traps hurt as well. + Photophobia,+ phonophobia.  Nausea only once--yesterday, never with vomiting. Has had this pain throughout the day since January.   Has not been treated for migraines   Current outpatient prescriptions:  .  acetaminophen-codeine (TYLENOL #3) 300-30 MG tablet, Take 1-2 tablets by mouth every 6 (six) hours as needed for moderate pain., Disp: 15 tablet, Rfl: 0 .  Naproxen Sodium (FLANAX PAIN RELIEF PO), Take 500 mg by mouth daily as needed., Disp: , Rfl:    No Known Allergies     Review of Systems     Objective:   Physical Exam Appears uncomfortable HEENT:  Tender over musculature and tendons of cervical paraspinous region to nuchal ridge and around parietal/temporal area to forehead and maxillary areas bilaterally. PERRL, EOMI, discs sharp, squints against light, TMs pearly gray, throat without injection. Neck:  Supple, no adenopathy Chest:  CTA CV:  RRR without murmur or rub Neuro:  A & O x3,  CN  II-XII grossly intact.  NO focal motor or sensory loss.        Assessment & Plan:  Head and Facial Pain:  While opthalmologic evaluation is still ongoing, will attempt to treat as migraine with MRI finding and some of her current headache symptoms. She is also suffering from Muscle tension type headaches. Not clear if the latter is due to her chronic pain and associated stress or related to her original symptoms. Possibility of Trigeminal Neuralgia as well, though symptoms not really episodic as would expect and the sharp, burning symptoms into the forehead and maxillary areas about eyes are not a prominent complaint.  She has really just had chronic sustained pain. Will start Propranolol 20 mg twice daily and have her come back for BP and pulse check in 2 weeks and consider titration up to 40 mg twice daily at that follow up She is to start warm bath, followed by neck stretches and massage (husband) twice daily as well. If no improvement, consider Gabapentin for possibility of Trigeminal Neuralgia. Originally ordered Verapamil for migraine prevention, but [facility].  Pt. Called back with switch to Propranolol and had already been tried on that for 2 weeks without improvement. Able to find the Verapamil CR at a low [facility] at [facility]--will send.

## 2015-10-17 NOTE — Addendum Note (Signed)
Addended by: Marcene DuosMULBERRY, Leanette Eutsler M on: 10/17/2015 05:04 PM   Modules accepted: Orders, Medications

## 2015-10-17 NOTE — Patient Instructions (Signed)
Warm bath soak for 20 minutes twice daily and stretches with massage twice daily

## 2015-10-22 ENCOUNTER — Telehealth: Payer: Self-pay | Admitting: Internal Medicine

## 2015-10-22 NOTE — Telephone Encounter (Signed)
Patient called stating she had very strong eye pain and headache yesterday and wants some advice on what she needs to do if she needs to continue with the medicine or if she needs to come in for an appointment. Please advise

## 2015-10-24 NOTE — Telephone Encounter (Signed)
Patient scheduled for next week.

## 2015-10-31 ENCOUNTER — Telehealth: Payer: Self-pay

## 2015-10-31 ENCOUNTER — Ambulatory Visit: Payer: Self-pay

## 2015-10-31 VITALS — BP 100/60 | HR 81

## 2015-10-31 DIAGNOSIS — R51 Headache: Principal | ICD-10-CM

## 2015-10-31 DIAGNOSIS — R519 Headache, unspecified: Secondary | ICD-10-CM

## 2015-10-31 NOTE — Telephone Encounter (Signed)
See visit information. Patient requesting a new medication for eye pain.

## 2015-11-06 NOTE — Telephone Encounter (Signed)
Not clear what she is talking about regarding a second medication.  We originally sent Propranolol, but she called back and remembered that had been tried before without success.   That's when we switched to the Verapamil.   She was supposed to come back in 2 weeks for bp and pulse check--did that happen?

## 2015-11-11 NOTE — Telephone Encounter (Signed)
Unable to reach patient by phone today.

## 2015-11-14 ENCOUNTER — Encounter: Payer: Self-pay | Admitting: Internal Medicine

## 2015-11-14 ENCOUNTER — Ambulatory Visit (INDEPENDENT_AMBULATORY_CARE_PROVIDER_SITE_OTHER): Payer: No Typology Code available for payment source | Admitting: Internal Medicine

## 2015-11-14 ENCOUNTER — Telehealth: Payer: Self-pay

## 2015-11-14 VITALS — BP 122/70 | HR 72 | Resp 16 | Ht 60.0 in | Wt 114.0 lb

## 2015-11-14 DIAGNOSIS — R519 Headache, unspecified: Secondary | ICD-10-CM

## 2015-11-14 DIAGNOSIS — R51 Headache: Secondary | ICD-10-CM

## 2015-11-14 NOTE — Progress Notes (Signed)
   Subjective:    Patient ID: Natalie Waller, female    DOB: 06-17-1970, 45 y.o.   MRN: [id number]  HPI   1.  Headache and facial pain:  Looking at diagnosis of migraine vs. Trigeminal neuralgia pain in patient with changing symptomatology.   Had previously been tried on the propranolol, which did not realize until after her last visit when prescribed from this office.   Did get Verapamil, she believes on 6/23 or 6/24.  States unable to get at [facility] and the Rx was transferred [facility].  She took the medication for about 3 weeks and stopped as she did not note a difference in her facial pain.  Today, now stating she has been having pain both right and left, which a bit different than previously.  Does state that the head and neck pain is actually better.     Current outpatient prescriptions:  .  acetaminophen-codeine (TYLENOL #3) 300-30 MG tablet, Take 1-2 tablets by mouth every 6 (six) hours as needed for moderate pain. (Patient not taking: Reported on 10/31/2015), Disp: 15 tablet, Rfl: 0 .  Naproxen Sodium (FLANAX PAIN RELIEF PO), Take 500 mg by mouth daily as needed. Reported on 11/14/2015, Disp: , Rfl:  .  verapamil (VERELAN PM) 120 MG 24 hr capsule, 1 capsule by mouth once daily in the evening (Patient not taking: Reported on 11/14/2015), Disp: 30 capsule, Rfl: 11    No Known Allergies  Review of Systems     Objective:   Physical Exam   NAD Lungs:  CTA CV:  RRR without murmur or rub Exam otherwise unchanged, although she appears happier, smiling and seems in much less pain today.        Assessment & Plan:  1.  Head and facial pain:  Actually looks a bit better, save for around eyes and historically, the headache pain seems to be better.  To give the Verapamil 3 months before determining not effective.   Checked with [facility] and they now have the Rx for her to pick up--previously did forward Rx to [facility].  Cost will be  $10 Pt. Called and message left by Estefania.

## 2015-11-14 NOTE — Telephone Encounter (Signed)
[facility] to [facility] rx [facility] and fill it today. The prescription cost should be 10 dollars a month at [facility]. To Estefania to notify patient.

## 2015-12-08 ENCOUNTER — Telehealth: Payer: Self-pay

## 2015-12-08 ENCOUNTER — Ambulatory Visit: Payer: Self-pay | Admitting: Adult Health

## 2015-12-08 NOTE — Telephone Encounter (Signed)
Patient did not show to appt today  

## 2015-12-09 ENCOUNTER — Encounter: Payer: Self-pay | Admitting: Adult Health

## 2016-01-01 ENCOUNTER — Telehealth: Payer: Self-pay | Admitting: Internal Medicine

## 2016-01-01 NOTE — Telephone Encounter (Signed)
Please add patient to waitlist for earlier appointment or an acute if there is one available.

## 2016-01-01 NOTE — Telephone Encounter (Signed)
Patient added to wait list and scheduled for Bp check on 01/02/16 @ 10:00 AM

## 2016-01-01 NOTE — Telephone Encounter (Signed)
Patient called stating verapamil (VERELAN PM) 120 MG 24 hr capsule is not helping her. States she feels stressed and nerves around neck are tense. Patient has FU appointment on 02/12/16 @ 9:30 AM

## 2016-01-02 ENCOUNTER — Ambulatory Visit: Payer: Self-pay

## 2016-01-02 VITALS — BP 102/58 | HR 80

## 2016-01-02 DIAGNOSIS — Z5181 Encounter for therapeutic drug level monitoring: Secondary | ICD-10-CM

## 2016-01-02 NOTE — Progress Notes (Signed)
Patient here today for a BP check on Verapamil. Patient states she is having pain in the neck still. She no showed the last neurology appointment and plans to [facility] in October because it costs too much and she does not think they are helpful. She is going to wait to be seen here in October and see if taking the verapamil, ibuprofen and cetirizine will help symptoms resolve by her next appointment. She has seen some improvement already.

## 2016-02-10 ENCOUNTER — Ambulatory Visit: Payer: Self-pay | Admitting: Adult Health

## 2016-02-11 ENCOUNTER — Encounter: Payer: Self-pay | Admitting: Adult Health

## 2016-02-12 ENCOUNTER — Encounter: Payer: Self-pay | Admitting: Internal Medicine

## 2016-02-12 ENCOUNTER — Ambulatory Visit (INDEPENDENT_AMBULATORY_CARE_PROVIDER_SITE_OTHER): Payer: Self-pay | Admitting: Internal Medicine

## 2016-02-12 VITALS — BP 114/70 | HR 70 | Ht 60.0 in | Wt 120.5 lb

## 2016-02-12 DIAGNOSIS — G43009 Migraine without aura, not intractable, without status migrainosus: Secondary | ICD-10-CM

## 2016-02-12 DIAGNOSIS — G43909 Migraine, unspecified, not intractable, without status migrainosus: Secondary | ICD-10-CM | POA: Insufficient documentation

## 2016-02-12 DIAGNOSIS — M542 Cervicalgia: Secondary | ICD-10-CM

## 2016-02-12 NOTE — Patient Instructions (Signed)
[facility]--check online.

## 2016-02-12 NOTE — Progress Notes (Signed)
   Subjective:    Patient ID: Lockie ParesShirley Diaz Perez, female    DOB: [birth year], 45 y.o.   MRN: [id number]  HPI   Chronic facial and headache pain:  Right facial, temporal head pain is definitely better.   She is taking Verapamil regularly.   She is now having more pain in her neck and nuchal area.   Is going to gym and working out, which has helped as well.   Has never been to PT for evaluation and treatment  Current Meds  Medication Sig  . verapamil (VERELAN PM) 120 MG 24 hr capsule 1 capsule by mouth once daily in the evening   No Known Allergies    Review of Systems     Objective:   Physical Exam  NAD HEENT:  PERRL, EOMI, TMs pearly gray, throat without injection Neck:  Traps tender and tight bilaterally.  Tender over cervical paraspinous musculature to nuchal ridge bilaterally, especially on right.   No adenopathy Chest:  CTA CV:  RRR without murmur or rub. Neuro:  A & O x 3, CN II-XII intact.  Motor, sensory and DTRs of upper extrems normal       Assessment & Plan:  1.  Right periorbital/facial pain:  This seems to have improved greatly with use of Verapamil for likely Migraine Headache.  2.  Neck Pain:  Appears to have muscular . Send for PT for treatment and home exercise program to relieve muscle spasm and pain.

## 2016-03-22 ENCOUNTER — Encounter: Payer: Self-pay | Admitting: Internal Medicine

## 2016-03-22 ENCOUNTER — Ambulatory Visit (INDEPENDENT_AMBULATORY_CARE_PROVIDER_SITE_OTHER): Payer: Self-pay | Admitting: Internal Medicine

## 2016-03-22 VITALS — BP 118/68 | HR 72 | Resp 12 | Ht 59.0 in | Wt 122.0 lb

## 2016-03-22 DIAGNOSIS — M542 Cervicalgia: Secondary | ICD-10-CM

## 2016-03-22 MED ORDER — DIAZEPAM 5 MG PO TABS: ORAL_TABLET | ORAL | 0 refills | Status: DC

## 2016-03-22 NOTE — Progress Notes (Signed)
   Subjective:    Patient ID: Natalie Waller, female    DOB: 26-Sep-1970, 45 y.o.   MRN: [id number]  HPI   Having same neck and nuchal pain as last visit. Has not been to PT as of yet.  Husband massaging neck nightly and helps, but does not last. Has never tried a muscle relaxant and is not sleeping well due to the neck and nuchal area pain.  Current Meds  Medication Sig  . verapamil (VERELAN PM) 120 MG 24 hr capsule 1 capsule by mouth once daily in the evening   No Known Allergies   Review of Systems     Objective:   Physical Exam  NAD Neck:  Tender over traps bilaterally, both sides tight.  Tender over cervical paraspinous processes to nuchal ridge bilaterally. Neuro remains intact      Assessment & Plan:  Chronic headache pain/facial pain/neck issues:  Await PT referral. Add Valium 5 mg 1/2 to 1 tab at bedtime as needed for muscle relaxation and sleep.  #20 tabs.  Discussed would not be a recurrent Rx

## 2016-03-22 NOTE — Patient Instructions (Signed)
Call if you have not heard about physical therapy in 1 month Do neck stretches after 20 minutes of heating pad before bedtime and in the morning

## 2016-04-29 ENCOUNTER — Telehealth: Payer: Self-pay | Admitting: Internal Medicine

## 2016-06-10 ENCOUNTER — Encounter: Payer: Self-pay | Admitting: Internal Medicine

## 2016-06-10 ENCOUNTER — Ambulatory Visit (INDEPENDENT_AMBULATORY_CARE_PROVIDER_SITE_OTHER): Payer: Self-pay | Admitting: Internal Medicine

## 2016-06-10 VITALS — BP 122/78 | HR 80 | Resp 12 | Ht 60.0 in | Wt 123.0 lb

## 2016-06-10 DIAGNOSIS — Z1231 Encounter for screening mammogram for malignant neoplasm of breast: Secondary | ICD-10-CM

## 2016-06-10 DIAGNOSIS — Z13220 Encounter for screening for lipoid disorders: Secondary | ICD-10-CM

## 2016-06-10 DIAGNOSIS — Z79899 Other long term (current) drug therapy: Secondary | ICD-10-CM

## 2016-06-10 DIAGNOSIS — Z124 Encounter for screening for malignant neoplasm of cervix: Secondary | ICD-10-CM

## 2016-06-10 DIAGNOSIS — Z0000 Encounter for general adult medical examination without abnormal findings: Secondary | ICD-10-CM

## 2016-06-10 DIAGNOSIS — J302 Other seasonal allergic rhinitis: Secondary | ICD-10-CM

## 2016-06-10 DIAGNOSIS — G44229 Chronic tension-type headache, not intractable: Secondary | ICD-10-CM

## 2016-06-10 DIAGNOSIS — Z1239 Encounter for other screening for malignant neoplasm of breast: Secondary | ICD-10-CM

## 2016-06-10 MED ORDER — CETIRIZINE HCL 10 MG PO TABS: 10.0000 mg | ORAL_TABLET | Freq: Every day | ORAL | 11 refills | Status: DC

## 2016-06-10 NOTE — Progress Notes (Addendum)
Subjective:    Patient ID: Natalie Waller, female    DOB: August 11, 1970, 46 y.o.   MRN: [id number]  HPI    CPE with pap  1.  Pap:  Last one 5 years ago.  Always normal.  No family history of cervical cancer.  2.  Mammogram:  Never.  No family history.  3.  Osteoprevention:  1-2 servings of dairy or calcium/Vitamin D (Almond Milk ) daily  4.  Guaiac Cards:  Never.  No family history of colon cancer.  5.  Colonoscopy:  Never.  6.  Immunizations: Cannot recall last Td--more than 10 years.  Immunization History  Administered Date(s) Administered  . Influenza-Unspecified 02/12/2016    Current Outpatient Prescriptions on File Prior to Visit  Medication Sig Dispense Refill  . diazepam (VALIUM) 5 MG tablet 1/2 to 1 tab at bedtime for neck pain (Patient not taking: Reported on 06/10/2016) 20 tablet 0  . verapamil (VERELAN PM) 120 MG 24 hr capsule 1 capsule by mouth once daily in the evening (Patient not taking: Reported on 06/10/2016) 30 capsule 11   No current facility-administered medications on file prior to visit.    No Known Allergies   Past Medical History:  Diagnosis Date  . Headache 2017  . Neck pain 2017   Past Surgical History:  Procedure Laterality Date  . APPENDECTOMY  2009  . CESAREAN SECTION  1997   Social History   Social History  . Marital status: Married    Spouse name: Alecia Lemming  . Number of children: 1  . Years of education: 9   Occupational History  . unemployed      Previously worked as Education officer, environmental in a gym   Social History Main Topics  . Smoking status: Never Smoker  . Smokeless tobacco: Never Used  . Alcohol use No  . Drug use: No  . Sexual activity: Not Currently   Other Topics Concern  . Not on file   Social History Narrative   Originally from [place]   Came to [place]. In [place]   Married her current husband, Alecia Lemming May 2017   Right-handed   No caffeine use    Family History  Problem Relation Age of Onset  . Hepatitis C Mother       Review of Systems  Constitutional: Positive for fatigue. Negative for appetite change and fever.       Fatigue at times due to headaches and neck pain  HENT: Negative for dental problem, ear pain, hearing loss and sneezing.        Ears itch for past 2 weeks.   Eyes: Positive for itching and visual disturbance.       Maybe some eye redness in past 2 weeks.  Uses reading glasses  Respiratory: Negative for cough and shortness of breath.   Cardiovascular: Negative for chest pain, palpitations and leg swelling.  Gastrointestinal: Negative for abdominal pain, blood in stool, constipation, diarrhea, nausea and vomiting.       No melena or hematochezia  Genitourinary: Negative for dysuria, frequency and vaginal discharge.       Periods have become a bit irregular as feels she is going through menopause. Having hot flashes Uses condoms at times.  Musculoskeletal: Positive for neck pain. Negative for arthralgias.  Skin: Negative for rash.  Neurological: Positive for headaches. Negative for weakness and numbness.  Hematological: Negative for adenopathy. Does not bruise/bleed easily.  Psychiatric/Behavioral: Negative for dysphoric mood. The patient is not nervous/anxious.  Objective:   Physical Exam  Constitutional: She is oriented to person, place, and time. She appears well-developed and well-nourished.  HENT:  Head: Normocephalic and atraumatic.  Right Ear: Hearing, tympanic membrane, external ear and ear canal normal.  Left Ear: Hearing, tympanic membrane, external ear and ear canal normal.  Nose: Mucosal edema present.  Mouth/Throat: Uvula is midline, oropharynx is clear and moist and mucous membranes are normal.  Mild posterior pharyngeal cobbling.  Eyes: Conjunctivae and EOM are normal. Pupils are equal, round, and reactive to light. Right eye exhibits no discharge.  Discs sharp bilaterally  Neck: Normal range of motion. Neck supple. No thyromegaly present.   Cardiovascular: Normal rate, regular rhythm, S1 normal and S2 normal.  Exam reveals no S3, no S4 and no friction rub.   No murmur heard. No carotid bruits.  Carotid, radial, femoral, DP and PT pulses normal and equal.   Pulmonary/Chest: Effort normal and breath sounds normal. Right breast exhibits no inverted nipple, no mass, no nipple discharge, no skin change and no tenderness. Left breast exhibits no inverted nipple, no mass, no nipple discharge, no skin change and no tenderness.  Abdominal: Soft. Bowel sounds are normal. She exhibits no mass. There is no hepatosplenomegaly. There is no tenderness. No hernia.  Midline and infraumbilical surgical scars  Genitourinary: Vagina normal. Rectal exam shows guaiac negative stool. There is no rash or lesion on the right labia. There is no rash or lesion on the left labia. Cervix exhibits no motion tenderness and no friability. Right adnexum displays no mass and no tenderness. Left adnexum displays no mass and no tenderness.  Genitourinary Comments: Uterus deviated somewhat to patient's left--difficult to fully view lower end of cervix as slightly retroverted. Large swabs used to clean away menstrual blood for improved view.  Musculoskeletal: Normal range of motion. She exhibits no edema or deformity.  Lymphadenopathy:       Head (right side): No submental and no submandibular adenopathy present.       Head (left side): No submental and no submandibular adenopathy present.    She has no cervical adenopathy.    She has no axillary adenopathy.       Right: No inguinal and no supraclavicular adenopathy present.       Left: No inguinal and no supraclavicular adenopathy present.  Neurological: She is alert and oriented to person, place, and time. She has normal strength and normal reflexes. No cranial nerve deficit or sensory deficit. Coordination and gait normal.  Skin: Skin is warm and dry. No lesion and no rash noted.  Psychiatric: She has a normal mood  and affect. Her speech is normal and behavior is normal. Judgment and thought content normal. Cognition and memory are normal.          Assessment & Plan:  1.  CPE with pap and fasting labs: Mammogram scholarship and appt. Pap sent FLP, CBC, CMP  2.  Allergies:  Zyrtec 10 mg daily as needed for symptoms  3.  Chronic headaches and neck pain:  Started on Verapamil last year with significant decrease in headaches, which seem to be a combination of migraine and muscle tension headache.  She stopped for unclear reasons 1 week ago.  Encouraged her to restart as long as she is not having any problems. Continues with PT in [place]--just started 2 weeks ago.  Seems to be helping

## 2016-06-11 LAB — CBC WITH DIFFERENTIAL/PLATELET
BASOS: 1 %
Basophils Absolute: 0 10*3/uL (ref 0.0–0.2)
EOS (ABSOLUTE): 0.3 10*3/uL (ref 0.0–0.4)
EOS: 4 %
HEMATOCRIT: 40.3 % (ref 34.0–46.6)
Hemoglobin: 13.3 g/dL (ref 11.1–15.9)
Immature Grans (Abs): 0 10*3/uL (ref 0.0–0.1)
Immature Granulocytes: 0 %
LYMPHS ABS: 1.3 10*3/uL (ref 0.7–3.1)
Lymphs: 18 %
MCH: 30.5 pg (ref 26.6–33.0)
MCHC: 33 g/dL (ref 31.5–35.7)
MCV: 92 fL (ref 79–97)
MONOS ABS: 0.7 10*3/uL (ref 0.1–0.9)
Monocytes: 9 %
Neutrophils Absolute: 5.2 10*3/uL (ref 1.4–7.0)
Neutrophils: 68 %
Platelets: 300 10*3/uL (ref 150–379)
RBC: 4.36 x10E6/uL (ref 3.77–5.28)
RDW: 13.7 % (ref 12.3–15.4)
WBC: 7.6 10*3/uL (ref 3.4–10.8)

## 2016-06-11 LAB — COMPREHENSIVE METABOLIC PANEL
ALK PHOS: 63 IU/L (ref 39–117)
ALT: 18 IU/L (ref 0–32)
AST: 20 IU/L (ref 0–40)
Albumin/Globulin Ratio: 1.6 (ref 1.2–2.2)
Albumin: 4.5 g/dL (ref 3.5–5.5)
BUN/Creatinine Ratio: 13 (ref 9–23)
BUN: 9 mg/dL (ref 6–24)
Bilirubin Total: 0.4 mg/dL (ref 0.0–1.2)
CO2: 25 mmol/L (ref 18–29)
Calcium: 8.9 mg/dL (ref 8.7–10.2)
Chloride: 99 mmol/L (ref 96–106)
Creatinine, Ser: 0.67 mg/dL (ref 0.57–1.00)
GFR calc Af Amer: 123 mL/min/{1.73_m2} (ref >59)
GFR, EST NON AFRICAN AMERICAN: 106 mL/min/{1.73_m2} (ref >59)
GLOBULIN, TOTAL: 2.8 g/dL (ref 1.5–4.5)
Glucose: 84 mg/dL (ref 65–99)
Potassium: 4.5 mmol/L (ref 3.5–5.2)
SODIUM: 140 mmol/L (ref 134–144)
Total Protein: 7.3 g/dL (ref 6.0–8.5)

## 2016-06-11 LAB — LIPID PANEL W/O CHOL/HDL RATIO
CHOLESTEROL TOTAL: 194 mg/dL (ref 100–199)
HDL: 41 mg/dL (ref >39)
LDL Calculated: 106 mg/dL — ABNORMAL HIGH (ref 0–99)
TRIGLYCERIDES: 234 mg/dL — AB (ref 0–149)
VLDL Cholesterol Cal: 47 mg/dL — ABNORMAL HIGH (ref 5–40)

## 2016-06-14 LAB — CYTOLOGY - PAP

## 2016-06-18 NOTE — Progress Notes (Signed)
Called patient and no answer and voicemail not set up 

## 2016-06-23 NOTE — Progress Notes (Signed)
Patient was called with lab results.

## 2016-06-24 ENCOUNTER — Other Ambulatory Visit (INDEPENDENT_AMBULATORY_CARE_PROVIDER_SITE_OTHER): Payer: Self-pay

## 2016-06-24 DIAGNOSIS — Z1211 Encounter for screening for malignant neoplasm of colon: Secondary | ICD-10-CM

## 2016-06-24 LAB — POC HEMOCCULT BLD/STL (HOME/3-CARD/SCREEN)
Card #2 Fecal Occult Blod, POC: NEGATIVE
Card #3 Fecal Occult Blood, POC: NEGATIVE
FECAL OCCULT BLD: NEGATIVE

## 2016-06-24 NOTE — Progress Notes (Signed)
Discussed labs with patient

## 2016-07-01 NOTE — Progress Notes (Signed)
Patient was given results of labs over the phone

## 2016-07-21 ENCOUNTER — Other Ambulatory Visit: Payer: Self-pay | Admitting: Internal Medicine

## 2016-11-02 ENCOUNTER — Encounter: Payer: Self-pay | Admitting: Internal Medicine

## 2016-11-02 ENCOUNTER — Ambulatory Visit (INDEPENDENT_AMBULATORY_CARE_PROVIDER_SITE_OTHER): Payer: Self-pay | Admitting: Family Medicine

## 2016-11-02 VITALS — BP 112/72 | HR 80 | Resp 12 | Ht 59.25 in | Wt 129.0 lb

## 2016-11-02 DIAGNOSIS — H6991 Unspecified Eustachian tube disorder, right ear: Secondary | ICD-10-CM

## 2016-11-02 MED ORDER — LORATADINE 10 MG PO TABS: 10.0000 mg | ORAL_TABLET | Freq: Every day | ORAL | 1 refills | Status: DC

## 2016-11-02 MED ORDER — FLUTICASONE PROPIONATE 50 MCG/ACT NA SUSP: 2.0000 | Freq: Every day | NASAL | 1 refills | Status: DC

## 2016-11-02 NOTE — Patient Instructions (Addendum)
  Nice to meet you  Use the nasal spray 2 puff each nostril once a day  Loratadine 1 every day  The pressure should be better in one week  Tylenol for the neck pain four times a day as needed along with massage and heat    Encantada de conocerte  Use el aerosol nasal 2 soplos en cada orificio nasal una vez al da  Loratadina 1 todos los [place]  La presin debera ser mejor en una semana  Tylenol para el dolor de cuello cuatro veces al da segn sea necesario junto con [place] y

## 2016-11-02 NOTE — Progress Notes (Signed)
Subjective  Patient is presenting with the following illnesses  Pressure feeling in her face and ears.  Sometimes feels dizzy when moving or standing.  No specific ear pain but is sometimes muffled hearing.   No sneezing but ears feels itchy sometimes.   Eyes also scratchy.  No weakness or visual loss or fever or nausea and vomiting   Her neck pain is about the same.     Chief Complaint noted Review of Symptoms - see HPI PMH - Smoking status noted.     Objective Vital Signs reviewed Alert nad Eye - Pupils Equal Round Reactive to light, Extraocular movements intact, Fundi without hemorrhage or visible lesions, Conjunctiva without redness or discharge Neck:  No deformities, thyromegaly, masses, or tenderness noted.   Supple with full range of motion without pain. Nose:  External nasal examination shows no deformity or inflammation. Nasal mucosa are pink and moist without lesions or exudates. No septal dislocation or dislocation.No obstruction to airflow. Ear - left TM normal.. R tm cloudy and slightly retracted.  No erythema Canals clear    Assessments/Plans  Eustachian Tube dysfunction - symptoms seem most consistent with inflammation likely due to allergies. No sign of infection.  No neurologic signs.  Trial of antihistamine and nasal steroid

## 2016-12-10 ENCOUNTER — Telehealth: Payer: Self-pay | Admitting: Internal Medicine

## 2016-12-10 NOTE — Telephone Encounter (Signed)
Patient called stating will like to have a sooner appt. (has a appt. On 01/03/17). Patient stated head is bothering too much between  preassure and pain at the same time. Patient stated feels off balance and dizzy as well. Right eye pain,ichy ears symptoms for more than 10 days and are getting worse

## 2016-12-10 NOTE — Telephone Encounter (Signed)
Patient placed on waiting list for next Thursday and will be able to bring interpreter any other day if is in the afternoon.

## 2016-12-18 ENCOUNTER — Emergency Department (HOSPITAL_COMMUNITY)
Admission: EM | Admit: 2016-12-18 | Discharge: 2016-12-18 | Disposition: A | Discharge status: Home / Self Care | Payer: No Typology Code available for payment source | Attending: Emergency Medicine | Admitting: Emergency Medicine

## 2016-12-18 ENCOUNTER — Encounter (HOSPITAL_COMMUNITY): Payer: Self-pay

## 2016-12-18 DIAGNOSIS — R51 Headache: Secondary | ICD-10-CM | POA: Insufficient documentation

## 2016-12-18 DIAGNOSIS — R519 Headache, unspecified: Secondary | ICD-10-CM

## 2016-12-18 MED ORDER — METOCLOPRAMIDE HCL 5 MG/ML IJ SOLN
10.0000 mg | Freq: Once | INTRAMUSCULAR | Status: AC
  Administered 2016-12-18: 10 mg via INTRAVENOUS
  Filled 2016-12-18: qty 2

## 2016-12-18 MED ORDER — DEXAMETHASONE SODIUM PHOSPHATE 10 MG/ML IJ SOLN
10.0000 mg | Freq: Once | INTRAMUSCULAR | Status: AC
  Administered 2016-12-18: 10 mg via INTRAVENOUS
  Filled 2016-12-18: qty 1

## 2016-12-18 MED ORDER — BUTALBITAL-APAP-CAFFEINE 50-325-40 MG PO TABS: 1.0000 | ORAL_TABLET | Freq: Four times a day (QID) | ORAL | 0 refills | Status: DC | PRN

## 2016-12-18 MED ORDER — DIPHENHYDRAMINE HCL 50 MG/ML IJ SOLN
25.0000 mg | Freq: Once | INTRAMUSCULAR | Status: AC
  Administered 2016-12-18: 25 mg via INTRAVENOUS
  Filled 2016-12-18: qty 1

## 2016-12-18 NOTE — Discharge Instructions (Signed)
Please read and follow all provided instructions.  Your diagnoses today include:  1. Acute nonintractable headache, unspecified headache type     Tests performed today include:  Vital signs. See below for your results today.   Medications:  In the Emergency Department you received:  Reglan - antinausea/headache medication  Benadryl - antihistamine to counteract potential side effects of reglan  Decadron - steroid medication to help stop the headache from coming back  Take any prescribed medications only as directed.  Additional information:  Follow any educational materials contained in this packet.  You are having a headache. No specific cause was found today for your headache. It may have been a migraine or other cause of headache. Stress, anxiety, fatigue, and depression are common triggers for headaches.   Your headache today does not appear to be life-threatening or require hospitalization, but often the exact cause of headaches is not determined in the emergency department. Therefore, follow-up with your doctor is very important to find out what may have caused your headache and whether or not you need any further diagnostic testing or treatment.   Sometimes headaches can appear benign (not harmful), but then more serious symptoms can develop which should prompt an immediate re-evaluation by your doctor or the emergency department.  BE VERY CAREFUL not to take multiple medicines containing Tylenol (also called acetaminophen). Doing so can lead to an overdose which can damage your liver and cause liver failure and possibly death.   Follow-up instructions: Please follow-up with your primary care provider in the next 3 days for further evaluation of your symptoms.   Return instructions:   Please return to the Emergency Department if you experience worsening symptoms.  Return if the medications do not resolve your headache, if it recurs, or if you have multiple episodes of  vomiting or cannot keep down fluids.  Return if you have a change from the usual headache.  RETURN IMMEDIATELY IF you:  Develop a sudden, severe headache  Develop confusion or become poorly responsive or faint  Develop a fever above 100.47F or problem breathing  Have a change in speech, vision, swallowing, or understanding  Develop new weakness, numbness, tingling, incoordination in your arms or legs  Have a seizure  Please return if you have any other emergent concerns.  Additional Information:  Your vital signs today were: BP 117/67    Pulse 77    Temp 98.5 F (36.9 C) (Oral)    Resp 16    LMP 12/07/2016    SpO2 100%  If your blood pressure (BP) was elevated above 135/85 this visit, please have this repeated by your doctor within one month. --------------

## 2016-12-18 NOTE — ED Triage Notes (Signed)
Onset 2 weeks headache, worse around right eye.  Right lateral cheekbone swollen.  No cough/cold symptoms.  Pain radiates from chin across top of head and down into shoulders.

## 2016-12-18 NOTE — ED Provider Notes (Signed)
[facility]EMER[facility]ENCY DEPT Provider Note   CSN: [id number] Arrival date & time: 12/18/16  1408     History   Chief Complaint Chief Complaint  Patient presents with  . Headache    HPI Natalie Waller is a 46 y.o. female.  Patient presents with complaint of generalized headache which has been ongoing for one year and 8 months.Patient notes that the pain has been worse over the past 2 weeks. She describes a daily headache. When asked to show where the pain is, she points to around the eyes bilaterally, up over the top of her head and down into her neck. She has associated photophobia and phonophobia. She states  Occasional blurry vision in her right eye, no loss of vision. No fevers. She has a popping sensation in her ears at times. No sinus infection symptoms or TMJ pain. Pain is not worse with movement of the neck. She denies weakness in her arms or her legs. She denies difficulty walking or falls. She denies facial droop, slurred speech. No recent head injury. She has not been on any medications at home for her pain including over-the-counter medications. She states that she saw her primary care physician 2 weeks ago and was waiting for a call for instructions, but states they never called her.  Friend at bedside thought that her right eye appeared to be slightly swollen earlier. The onset of this condition was acute on chronic. The course is waxing and waning. Alleviating factors: none.        Past Medical History:  Diagnosis Date  . Headache 2017  . Neck pain 2017    Patient Active Problem List   Diagnosis Date Noted  . Migraine 02/12/2016  . Paresthesia 09/02/2015  . Neck pain 04/27/2015  . Headache 04/27/2015    Past Surgical History:  Procedure Laterality Date  . APPENDECTOMY  2009  . CESAREAN SECTION  1997    OB History    No data available       Home Medications    Prior to Admission medications   Medication Sig Start Date End Date Taking? Authorizing Provider   cetirizine (ZYRTEC ALLERGY) 10 MG tablet Take 1 tablet (10 mg total) by mouth daily. Patient not taking: Reported on 11/02/2016 06/10/16   Julieanne Manson, MD  fluticasone [facility]) 50 MCG/ACT nasal spray Place 2 sprays into both nostrils daily. 11/02/16   Carney Living, MD  loratadine (CLARITIN) 10 MG tablet Take 1 tablet (10 mg total) by mouth daily. 11/02/16   Carney Living, MD  verapamil (VERELAN PM) 120 MG 24 hr capsule 1 capsule by mouth once daily in the evening Patient not taking: Reported on 06/10/2016 10/17/15   Julieanne Manson, MD    Family History Family History  Problem Relation Age of Onset  . Hepatitis C Mother     Social History Social History  Substance Use Topics  . Smoking status: Never Smoker  . Smokeless tobacco: Never Used  . Alcohol use No     Allergies   Patient has no known allergies.   Review of Systems Review of Systems  Constitutional: Negative for fever.  HENT: Negative for congestion, dental problem, rhinorrhea and sinus pressure.   Eyes: Positive for photophobia and visual disturbance. Negative for discharge and redness.  Respiratory: Negative for shortness of breath.   Cardiovascular: Negative for chest pain.  Gastrointestinal: Negative for nausea and vomiting.  Musculoskeletal: Negative for gait problem, neck pain and neck stiffness.  Skin: Negative for rash.  Neurological: Positive for headaches. Negative for syncope, speech difficulty, weakness, light-headedness and numbness.  Psychiatric/Behavioral: Negative for confusion.     Physical Exam Updated Vital Signs BP 119/72   Pulse 69   Temp 98.5 F (36.9 C) (Oral)   Resp 16   LMP 12/07/2016   SpO2 99%   Physical Exam  Constitutional: She is oriented to person, place, and time. She appears well-developed and well-nourished.  HENT:  Head: Normocephalic and atraumatic.  Right Ear: Tympanic membrane, external ear and ear canal normal.  Left Ear: Tympanic  membrane, external ear and ear canal normal.  Nose: Nose normal.  Mouth/Throat: Uvula is midline, oropharynx is clear and moist and mucous membranes are normal.  No apparent facial or periorbital swelling.  Eyes: Pupils are equal, round, and reactive to light. Conjunctivae, EOM and lids are normal. Right eye exhibits no nystagmus. Left eye exhibits no nystagmus.  Normal-appearing globes.  Neck: Normal range of motion. Neck supple.  Cardiovascular: Normal rate and regular rhythm.   Pulmonary/Chest: Effort normal and breath sounds normal.  Abdominal: Soft. There is no tenderness.  Musculoskeletal:       Cervical back: She exhibits normal range of motion, no tenderness and no bony tenderness.  Neurological: She is alert and oriented to person, place, and time. She has normal strength and normal reflexes. No cranial nerve deficit or sensory deficit. She displays a negative Romberg sign. Coordination and gait normal. GCS eye subscore is 4. GCS verbal subscore is 5. GCS motor subscore is 6.  Skin: Skin is warm and dry.  Psychiatric: She has a normal mood and affect.  Nursing note and vitals reviewed.    ED Treatments / Results  Labs (all labs ordered are listed, but only abnormal results are displayed) Labs Reviewed - No data to display  EKG  EKG Interpretation None       Radiology No results found.  Procedures Procedures (including critical care time)  Medications Ordered in ED Medications  metoCLOPramide (REGLAN) injection 10 mg (10 mg Intravenous Given 12/18/16 2003)  diphenhydrAMINE (BENADRYL) injection 25 mg (25 mg Intravenous Given 12/18/16 2003)  dexamethasone (DECADRON) injection 10 mg (10 mg Intravenous Given 12/18/16 2002)     Initial Impression / Assessment and Plan / ED Course  I have reviewed the triage vital signs and the nursing notes.  Pertinent labs & imaging results that were available during my care of the patient were reviewed by me and considered in my  medical decision making (see chart for details).     Patient seen and examined. Medications ordered. Patient with chronic headache with no concerning features or neurological deficits today. Will treat with IV medications and bolus of normal saline. Anticipate discharge to home with medication, PCP follow-up.  Vital signs reviewed and are as follows: BP 119/72   Pulse 69   Temp 98.5 F (36.9 C) (Oral)   Resp 16   LMP 12/07/2016   SpO2 99%   9:08 PM patient reports improvement in her pain. She appears to be more comfortable. Will discharge to home. Encouraged her to get a good nights rest.  Discharge to home with abortive therapy for her headache.  Strongly encouraged follow-up with her PCP for management of her chronic headaches.  Patient counseled to return if they have weakness in their arms or legs, slurred speech, trouble walking or talking, confusion, trouble with their balance, or if they have any other concerns. Patient verbalizes understanding and agrees with plan.    Final Clinical  Impressions(s) / ED Diagnoses   Final diagnoses:  Acute nonintractable headache, unspecified headache type   Patient with c/o HA for nearly 2 years, but worse over the past several weeks. Patient without high-risk features of headache including: sudden onset/thunderclap HA, no similar headache in past, altered mental status, accompanying seizure, headache with exertion, age > 21, history of immunocompromise, neck or shoulder pain, fever, use of anticoagulation, family history of spontaneous SAH, concomitant drug use, toxic exposure.   Patient has a normal complete neurological exam, normal vital signs, normal level of consciousness, no signs of meningismus, is well-appearing/non-toxic appearing, no signs of trauma.   Imaging with CT/MRI not indicated given history and physical exam findings.   No dangerous or life-threatening conditions suspected or identified by history, physical exam, and by  work-up. No indications for hospitalization identified.    New Prescriptions New Prescriptions   BUTALBITAL-ACETAMINOPHEN-CAFFEINE (FIORICET, ESGIC) 50-325-40 MG TABLET    Take 1-2 tablets by mouth every 6 (six) hours as needed for headache.     Renne Crigler, PA-C 12/18/16 2109    Eber Hong, MD 12/19/16 [phone]

## 2017-01-03 ENCOUNTER — Ambulatory Visit (INDEPENDENT_AMBULATORY_CARE_PROVIDER_SITE_OTHER): Payer: Self-pay | Admitting: Internal Medicine

## 2017-01-03 ENCOUNTER — Encounter: Payer: Self-pay | Admitting: Internal Medicine

## 2017-01-03 VITALS — BP 110/70 | HR 80 | Resp 12 | Ht 59.25 in | Wt 127.0 lb

## 2017-01-03 DIAGNOSIS — J3089 Other allergic rhinitis: Secondary | ICD-10-CM

## 2017-01-03 DIAGNOSIS — G43009 Migraine without aura, not intractable, without status migrainosus: Secondary | ICD-10-CM

## 2017-01-03 DIAGNOSIS — M542 Cervicalgia: Secondary | ICD-10-CM

## 2017-01-03 DIAGNOSIS — Z23 Encounter for immunization: Secondary | ICD-10-CM

## 2017-01-03 HISTORY — DX: Other allergic rhinitis: J30.89

## 2017-01-03 MED ORDER — FEXOFENADINE HCL 180 MG PO TABS: ORAL_TABLET | ORAL | Status: DC

## 2017-01-03 MED ORDER — SUMATRIPTAN SUCCINATE 50 MG PO TABS: ORAL_TABLET | ORAL | 6 refills | Status: DC

## 2017-01-03 MED ORDER — FLUTICASONE PROPIONATE 50 MCG/ACT NA SUSP: NASAL | 6 refills | Status: DC

## 2017-01-03 MED ORDER — TOPIRAMATE 25 MG PO TABS: ORAL_TABLET | ORAL | 6 refills | Status: DC

## 2017-01-03 NOTE — Progress Notes (Signed)
   Subjective:    Patient ID: Natalie Waller, female    DOB: 15-Apr-1971, 46 y.o.   MRN: [id number]  HPI   1.  Headaches:  Her facial and neck pain have returned.  [facility] 4 times per patient in February of this year.  States she has been doing the exercises given for muscle pain in neck and head as well as vestibular function and has not noted a difference.   Feels she is still also having itching in her right nostril and right eye more so then left.  She did use Flonase for 10 days previously in July, but did not feel it or the Claritin have helped.   Patient at one point looked better and gave a report she was better while taking Verapamil, but states today her improvement with that was only minimal.   Has been back to ED end of August with headache again and given Butalbital combination, which she states is not particularly helpful.   Patient has been evaluated by Neurology, Dr. Anne HahnWillis, ENT, Dr. Suszanne Connerseoh, and Opthalmology, Dr. Daphine DeutscherMartin. She has had a CT of her brain, which showed no abnormalities, as well as an MRI which showed tiny nonspecific periventricular and subcortical white matter hyperintensities with differential discussed above (included migraines). No enhancing lesions are noted.  No outpatient prescriptions have been marked as taking for the 01/03/17 encounter (Office Visit) with Julieanne MansonMulberry, Yoltzin Barg, MD.    No Known Allergies    Review of Systems     Objective:   Physical Exam  Appears fatigued and as if not feeling well.  HEENT:  PERRL, EOMI, TMs gray, throat difficult to see well, but appears to have some posterior pharyngeal cobbling.Tender over maxillary area, right greater than left.  Nasal mucosa swollen and mildly inflamed. Neck:  Supple, No adenopathy.  Tender over traps bilaterally and paracervical musculature to nuchal ridge. Chest:  CTA CV:  RRR with normal S1 and S2, No S3, S4 or murmur.  Radial and DP pulses normal and  equal Neuro:  A & O x 3, CN  II-XII grossly intact.  Normal gait.  Unchanged from previous exams           Assessment & Plan:  Headaches, facial pain, ear, nose, and eye symptoms:  Discussed her symptoms are multifactorial and needs to stay on medication that helps, recognizing we are likely treating 3 separate concerns as below:  1.  Allergies:  Will switch to Fexofenadine 180 mg daily and restart Fluticasone nasal spray, 2 sprays each nostril daily.  Both of these are OTC.  To clean and change bed linens, cover mattress and pillow with hypoallergenic covers.  2.  Muscle Tension HA:  To continue to work on exercises from PT.  She is very tense and holds her shoulders high due to pain.  3.  Migraine HA:  To start Topiramate 25 mg nightly at bedtime ([facility] Rx [facility] then [facility])  [facility], however.  Increase to 50 mg in 2 weeks as long as tolerating.  To call in progress report in 1 month and will consider titrating up to 75 and then 100 mg every 2 weeks thereafter.  Discussed do not expect signficant prevention of headache until up to 100 mg  Follow up in 3 months

## 2017-04-07 ENCOUNTER — Ambulatory Visit: Payer: Self-pay | Admitting: Internal Medicine

## 2017-04-17 IMAGING — CT CT HEAD W/O CM
2 series · 15 of 30 positions shown, 17 images · non-contrast
Comparison: None.

CLINICAL DATA: Right-sided body pain. Head pain for the past 4
months. Blurry vision involving the right eye.

EXAM:
CT HEAD WITHOUT CONTRAST
TECHNIQUE: Contiguous axial images were obtained from the base of the skull
through the vertex without intravenous contrast.

[Series 2: head without · axial · non-contrast · 0.37mm/px · z∈[+1191,+1306]mm · 7 of 31 slices shown, 9 images]
[im 4/31  brain]
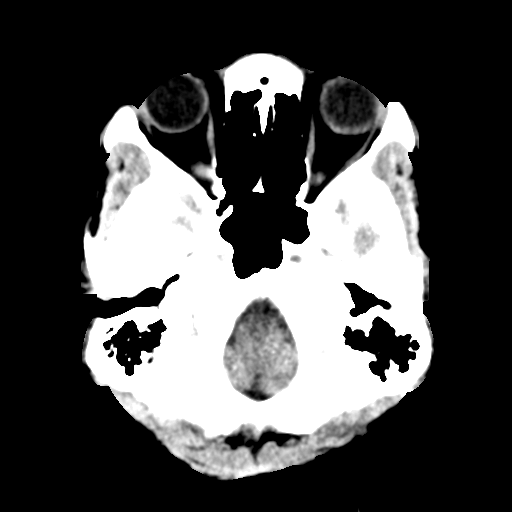
[im 4/31  bone]
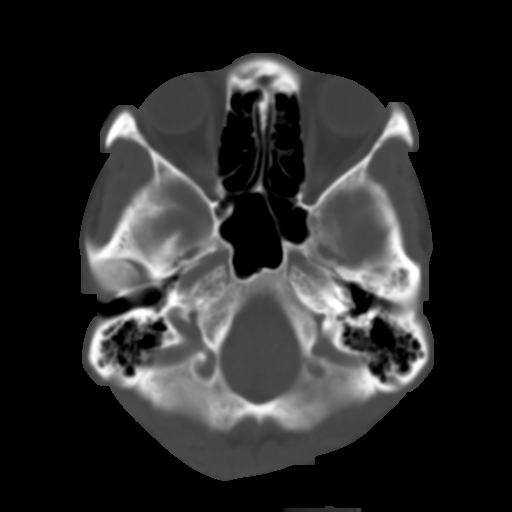
[im 8/31  brain]
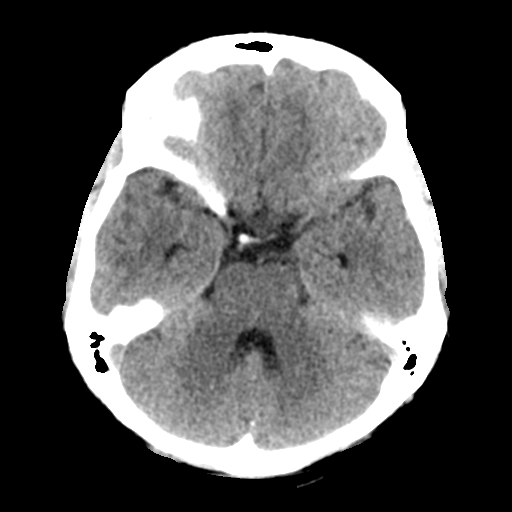
[im 12/31  brain]
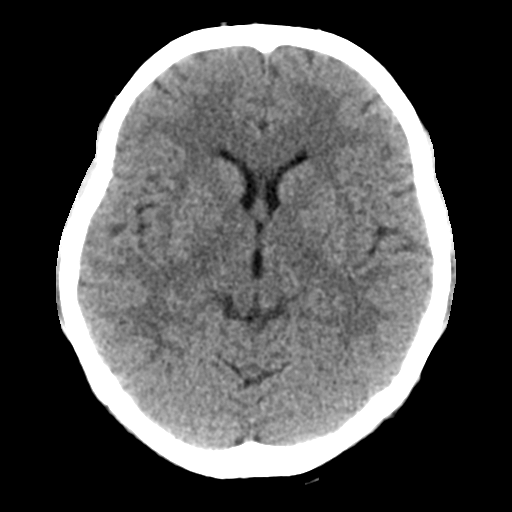
[im 16/31  brain]
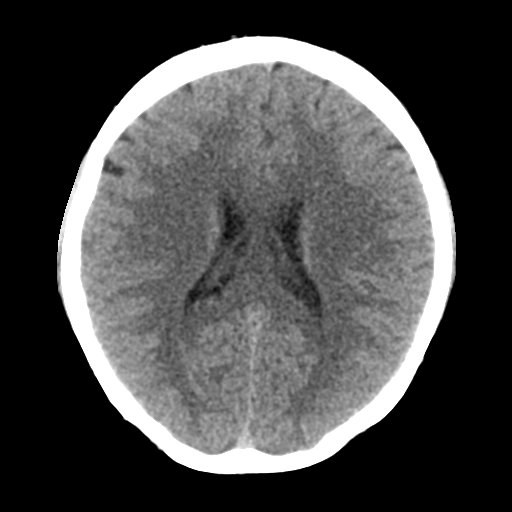
[im 19/31  brain]
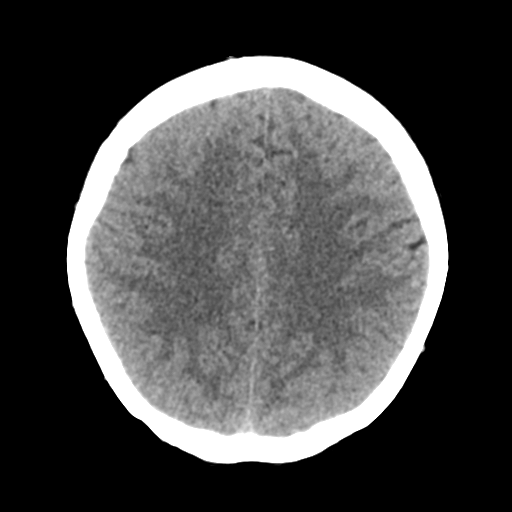
[im 19/31  bone]
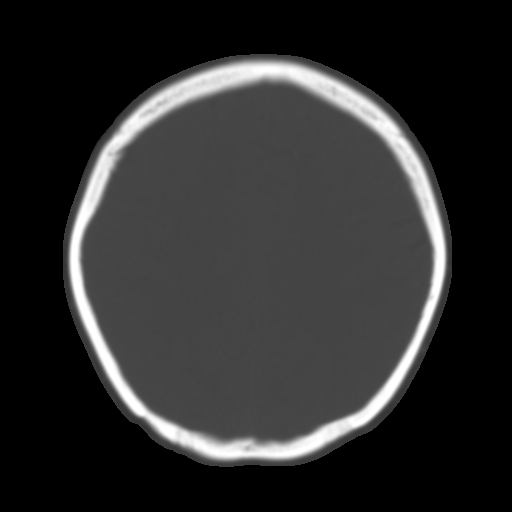
[im 23/31  brain]
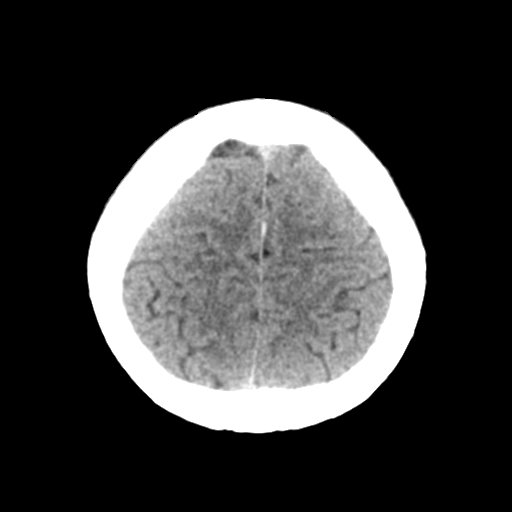
[im 27/31  brain]
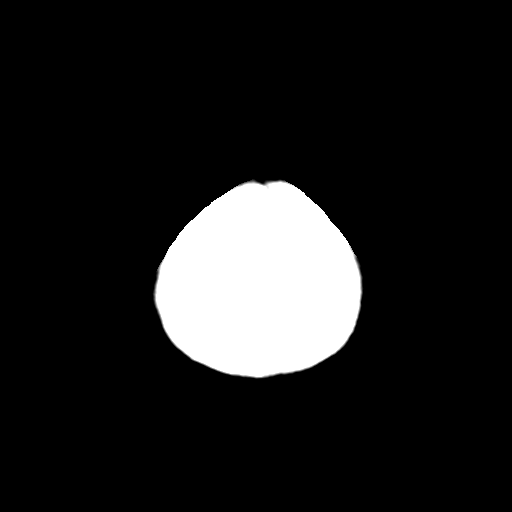

[Series 3: head bone · axial · 0.37mm/px · z∈[+1190,+1312]mm · 8 of 77 slices shown]
[im 8/77  bone]
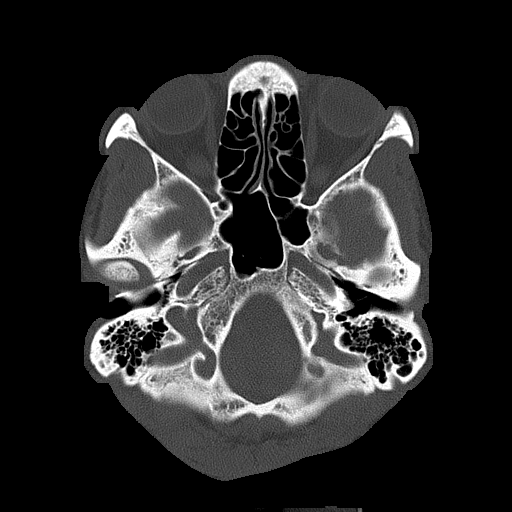
[im 16/77  bone]
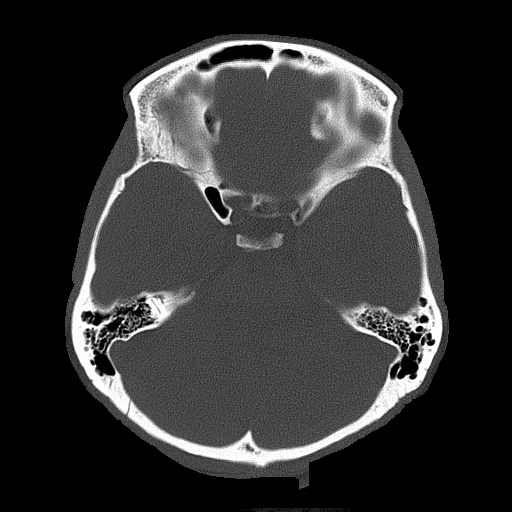
[im 23/77  bone]
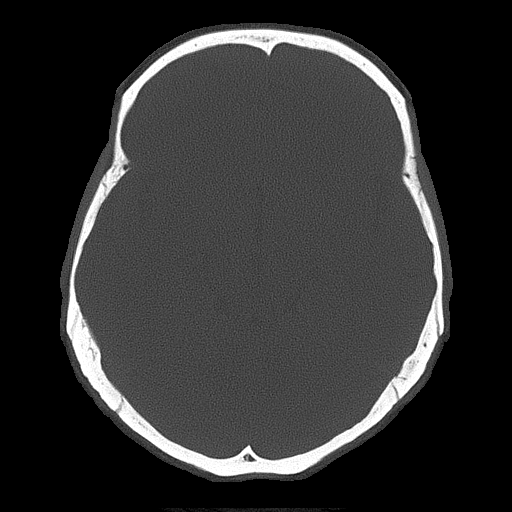
[im 35/77  bone]
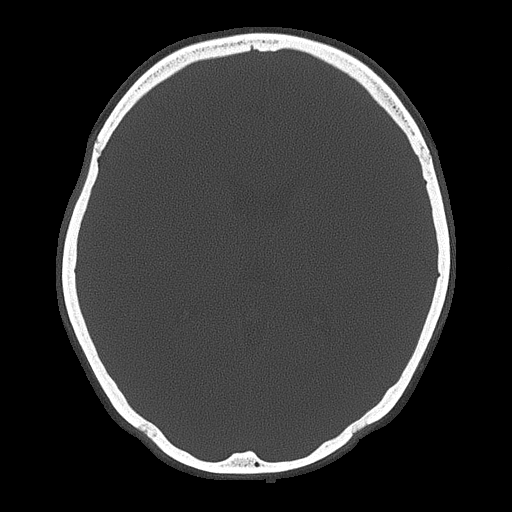
[im 42/77  bone]
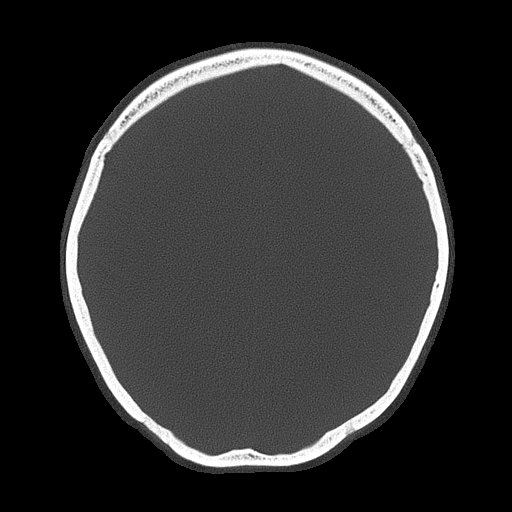
[im 54/77  bone]
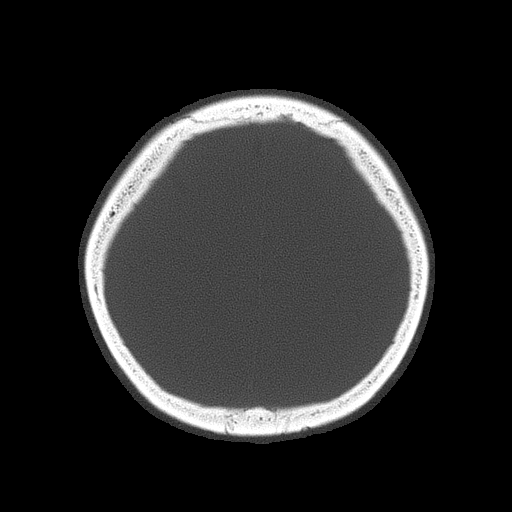
[im 61/77  bone]
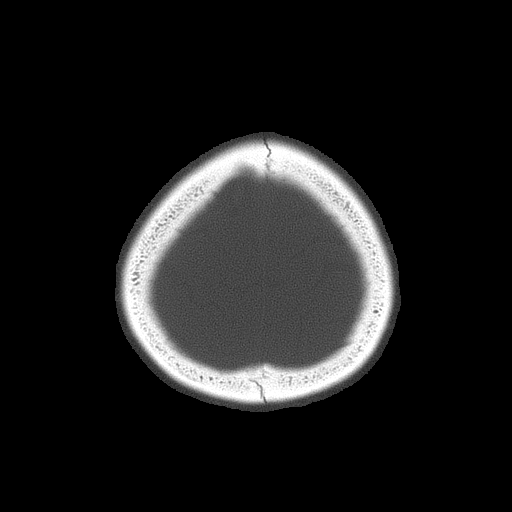
[im 69/77  bone]
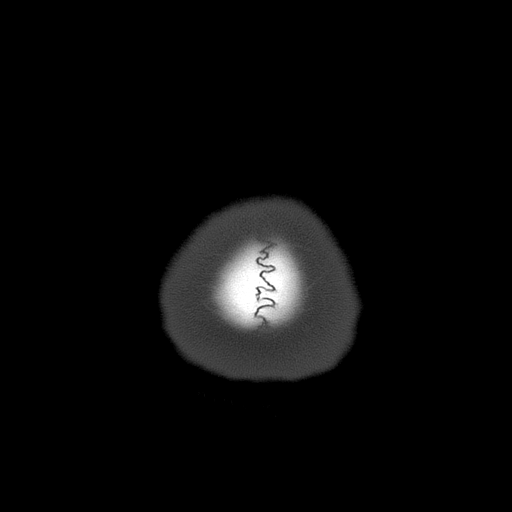

[15 of 30 positions shown; findings below may reference images not displayed]

FINDINGS: Brain: Gray-white differentiation is maintained. No CT evidence of
acute large territory infarct. No intraparenchymal or extra-axial
mass or hemorrhage. Normal size and configuration of the ventricles
and basilar cisterns. No midline shift.

Vascular: No hyperdense vessel or unexpected calcification.

Skull: Negative for fracture or focal lesion.

Sinuses/Orbits: Limited visualization of the paranasal sinuses and
mastoid air cells is normal. No air-fluid levels.

Limited noncontrast evaluation of the bilateral orbits and globes is
normal.

Other: Regional soft tissues appear normal.
IMPRESSION: Negative noncontrast head CT

## 2017-07-05 NOTE — Telephone Encounter (Signed)
Error

## 2017-11-04 LAB — GLUCOSE, POCT (MANUAL RESULT ENTRY): POC GLUCOSE: 103 mg/dL — AB (ref 70–99)

## 2017-11-24 ENCOUNTER — Encounter: Payer: Self-pay | Admitting: Pediatric Intensive Care

## 2017-11-28 ENCOUNTER — Emergency Department (HOSPITAL_COMMUNITY)
Admission: EM | Admit: 2017-11-28 | Discharge: 2017-11-28 | Disposition: A | Discharge status: Home / Self Care | Payer: No Typology Code available for payment source | Attending: Emergency Medicine | Admitting: Emergency Medicine

## 2017-11-28 ENCOUNTER — Encounter (HOSPITAL_COMMUNITY): Payer: Self-pay | Admitting: *Deleted

## 2017-11-28 DIAGNOSIS — R519 Headache, unspecified: Secondary | ICD-10-CM

## 2017-11-28 DIAGNOSIS — Z79899 Other long term (current) drug therapy: Secondary | ICD-10-CM | POA: Insufficient documentation

## 2017-11-28 DIAGNOSIS — R51 Headache: Secondary | ICD-10-CM | POA: Insufficient documentation

## 2017-11-28 MED ORDER — DIPHENHYDRAMINE HCL 50 MG/ML IJ SOLN
25.0000 mg | Freq: Once | INTRAMUSCULAR | Status: AC
  Administered 2017-11-28: 25 mg via INTRAVENOUS
  Filled 2017-11-28: qty 1

## 2017-11-28 MED ORDER — PROCHLORPERAZINE EDISYLATE 10 MG/2ML IJ SOLN
10.0000 mg | Freq: Once | INTRAMUSCULAR | Status: AC
  Administered 2017-11-28: 10 mg via INTRAVENOUS
  Filled 2017-11-28: qty 2

## 2017-11-28 MED ORDER — DEXAMETHASONE SODIUM PHOSPHATE 10 MG/ML IJ SOLN
10.0000 mg | Freq: Once | INTRAMUSCULAR | Status: AC
  Administered 2017-11-28: 10 mg via INTRAVENOUS
  Filled 2017-11-28: qty 1

## 2017-11-28 MED ORDER — SODIUM CHLORIDE 0.9 % IV BOLUS
1000.0000 mL | Freq: Once | INTRAVENOUS | Status: AC
  Administered 2017-11-28: 1000 mL via INTRAVENOUS

## 2017-11-28 MED ORDER — KETOROLAC TROMETHAMINE 30 MG/ML IJ SOLN
30.0000 mg | Freq: Once | INTRAMUSCULAR | Status: AC
Start: 2017-11-28 — End: 2017-11-28
  Administered 2017-11-28: 30 mg via INTRAVENOUS
  Filled 2017-11-28: qty 1

## 2017-11-28 NOTE — Discharge Instructions (Addendum)
Your exam is reassuring today, return for worsening headache, fevers, neck stiffness, vision changes or any other new or concerning symptoms as listed below.  Solicite ayuda de inmediato si: La migraa [employer]pe[employer] mucho. Tiene fiebre. Presenta rigidez en el cuello. Tiene dificultad para ver. Siente debilidad en los msculos o que no puede controlarlos. Comienza a perder el equilibrio continuamente. Comienza a tener dificultad para caminar. Pierde el conocimiento (se desmaya).

## 2017-11-28 NOTE — ED Provider Notes (Signed)
[facility] EM[facility]GENC[facility] Provider Note   CSN: [id number] Arrival date & time: 11/28/17  1251     History   Chief Complaint Chief Complaint  Patient presents with  . Migraine    HPI Lockie ParesShirley Diaz Perez is a 47 y.o. female.  Lockie ParesShirley Diaz Perez is a 47 y.o. Female with a history of chronic headaches, and seasonal allergies, who presents to the ED for evaluation of headache that started this morning and has gradually gotten worse.  Patient reports history of very similar headaches in the past, and she typically has at least some form of headache almost daily but this pain is worse than usual.  She has not taken anything to treat this pain.  Headache is located on the right side of the head and goes from behind the eye back across the head.  She reports this is what her typical headache feels like.  She reports sensitivity to light and sound.  Denies any vision changes or visual field cuts, no dizziness.  No ear pain, rhinorrhea or sinus congestion.  She denies any fevers or neck stiffness.  Rest, tingling or weakness in any of her extremities.  No facial droop or changes in speech.  Patient reports headache was not maximal in intensity at onset.  No associated chest pain, shortness of breath, abdominal pain, nausea or vomiting.  Additional story obtained from chart review, which shows very similar presentations in the past, which improved with migraine cocktail.     Past Medical History:  Diagnosis Date  . Environmental and seasonal allergies 01/03/2017  . Headache 2017  . Neck pain 2017  . Tension headache, chronic 04/27/2015    Patient Active Problem List   Diagnosis Date Noted  . Environmental and seasonal allergies 01/03/2017  . Migraine 02/12/2016  . Paresthesia 09/02/2015  . Neck pain 04/27/2015  . Tension headache, chronic 04/27/2015    Past Surgical History:  Procedure Laterality Date  . APPENDECTOMY  2009  . CESAREAN SECTION  1997     OB  History   None      Home Medications    Prior to Admission medications   Medication Sig Start Date End Date Taking? Authorizing Provider  Ascorbic Acid (VITAMIN C) 100 MG tablet Take 100 mg by mouth daily.    [provider]  fexofenadine (ALLEGRA) 180 MG tablet 1 tab by mouth daily as needed for allergies 01/03/17   Julieanne MansonMulberry, Elizabeth, MD  fluticasone [facility](FLONASE) 50 MCG/ACT nasal spray 2 sprays each nostril daily as needed for allergies 01/03/17   Julieanne MansonMulberry, Elizabeth, MD  SUMAtriptan (IMITREX) 50 MG tablet 1 to 2 tabs by mouth as needed for headache.  May repeat in 2 hours x1 if headache persists or recurs.  Max 200 mg/24 hours 01/03/17   Julieanne MansonMulberry, Elizabeth, MD  topiramate (TOPAMAX) 25 MG tablet 1 tab by mouth at bedtime for 2 weeks, then 2 tabs by mouth at bedtime 01/03/17   Julieanne MansonMulberry, Elizabeth, MD    Family History Family History  Problem Relation Age of Onset  . Hepatitis C Mother     Social History Social History   Tobacco Use  . Smoking status: Never Smoker  . Smokeless tobacco: Never Used  Substance Use Topics  . Alcohol use: No    Alcohol/week: 0.0 oz  . Drug use: No     Allergies   Patient has no known allergies.   Review of Systems Review of Systems  Constitutional: Negative for chills and fever.  HENT: Negative.  Negative for congestion, rhinorrhea and sore throat.   Eyes: Positive for photophobia. Negative for pain, redness and visual disturbance.  Respiratory: Negative for cough and shortness of breath.   Cardiovascular: Negative for chest pain.  Gastrointestinal: Negative for abdominal pain, nausea and vomiting.  Genitourinary: Negative for dysuria and frequency.  Musculoskeletal: Negative for arthralgias, back pain, myalgias, neck pain and neck stiffness.  Skin: Negative for color change and rash.  Neurological: Positive for headaches. Negative for dizziness, tremors, seizures, syncope, facial asymmetry, speech difficulty, weakness,  light-headedness and numbness.     Physical Exam Updated Vital Signs BP 103/66   Pulse (!) 59   Temp 98 F (36.7 C) (Oral)   Resp 15   SpO2 100%   Physical Exam  Constitutional: She is oriented to person, place, and time. She appears well-developed and well-nourished. No distress.  HENT:  Head: Normocephalic and atraumatic.  Mouth/Throat: Oropharynx is clear and moist.  Eyes: Pupils are equal, round, and reactive to light. EOM are normal. Right eye exhibits no discharge. Left eye exhibits no discharge.  No nystagmus bilaterally  Neck: Normal range of motion. Neck supple.  No rigidity, negative Brudzinski's and Kernig sign  Cardiovascular: Normal rate, regular rhythm, normal heart sounds and intact distal pulses.  Pulmonary/Chest: Effort normal and breath sounds normal. No respiratory distress.  Respirations equal and unlabored, patient able to speak in full sentences, lungs clear to auscultation bilaterally  Abdominal: Soft. Bowel sounds are normal. She exhibits no distension and no mass. There is no tenderness. There is no guarding.  Abdomen soft, nondistended, nontender to palpation in all quadrants without guarding or peritoneal signs  Musculoskeletal: She exhibits no edema or deformity.  Neurological: She is alert and oriented to person, place, and time. Coordination normal.  Speech is clear, able to follow commands CN III-XII intact Normal strength in upper and lower extremities bilaterally including dorsiflexion and plantar flexion, strong and equal grip strength Sensation normal to light and sharp touch Moves extremities without ataxia, coordination intact Normal finger to nose and rapid alternating movements No pronator drift  Skin: Skin is warm and dry. She is not diaphoretic.  Psychiatric: She has a normal mood and affect. Her behavior is normal.  Nursing note and vitals reviewed.    ED Treatments / Results  Labs (all labs ordered are listed, but only abnormal  results are displayed) Labs Reviewed - No data to display  EKG None  Radiology No results found.  Procedures Procedures (including critical care time)  Medications Ordered in ED Medications  ketorolac (TORADOL) 30 MG/ML injection 30 mg (30 mg Intravenous Given 11/28/17 1806)  sodium chloride 0.9 % bolus 1,000 mL (1,000 mLs Intravenous New Bag/Given 11/28/17 1815)  prochlorperazine (COMPAZINE) injection 10 mg (10 mg Intravenous Given 11/28/17 1806)  diphenhydrAMINE (BENADRYL) injection 25 mg (25 mg Intravenous Given 11/28/17 1807)  dexamethasone (DECADRON) injection 10 mg (10 mg Intravenous Given 11/28/17 1806)     Initial Impression / Assessment and Plan / ED Course  I have reviewed the triage vital signs and the nursing notes.  Pertinent labs & imaging results that were available during my care of the patient were reviewed by me and considered in my medical decision making (see chart for details).  Pt HA treated and improved while in ED.  Presentation is like pts typical HA and non concerning for [facility], [facility], Meningitis, or temporal arteritis. Pt is afebrile with no focal neuro deficits, nuchal rigidity, or change in vision. Pt is  to follow up with PCP to discuss prophylactic medication. Pt verbalizes understanding and is agreeable with plan to dc.    Final Clinical Impressions(s) / ED Diagnoses   Final diagnoses:  Bad headache    ED Discharge Orders    None       Dartha Lodge, [place] 11/28/17 1956    Sabas Sous, MD 11/28/17 2302

## 2017-11-28 NOTE — ED Triage Notes (Signed)
Pt in c/o headache, sensitivity to light, states she has a history of headaches almost daily but this pain is increased

## 2018-01-03 NOTE — Congregational Nurse Program (Signed)
Via interpreter Rod Holler- client states history of right sided facial pain and swelling. She states that this is reoccurring and that she has had evaluation for problem in the past. She states the pain is bothersome and that she has some light sensitivity. Client states she no longer has an [employer] and had been a client at [facility] in the past. Client agrees to return to [facility] for evaluation.

## 2018-01-05 ENCOUNTER — Ambulatory Visit: Payer: Self-pay | Admitting: Internal Medicine

## 2018-01-05 ENCOUNTER — Encounter: Payer: Self-pay | Admitting: Internal Medicine

## 2018-01-05 VITALS — BP 118/78 | HR 78 | Resp 12 | Ht 59.25 in | Wt 127.0 lb

## 2018-01-05 DIAGNOSIS — R739 Hyperglycemia, unspecified: Secondary | ICD-10-CM

## 2018-01-05 DIAGNOSIS — G43009 Migraine without aura, not intractable, without status migrainosus: Secondary | ICD-10-CM | POA: Insufficient documentation

## 2018-01-05 DIAGNOSIS — E785 Hyperlipidemia, unspecified: Secondary | ICD-10-CM

## 2018-01-05 DIAGNOSIS — R5383 Other fatigue: Secondary | ICD-10-CM

## 2018-01-05 DIAGNOSIS — E7800 Pure hypercholesterolemia, unspecified: Secondary | ICD-10-CM | POA: Insufficient documentation

## 2018-01-05 DIAGNOSIS — J3089 Other allergic rhinitis: Secondary | ICD-10-CM

## 2018-01-05 MED ORDER — FLUTICASONE PROPIONATE 50 MCG/ACT NA SUSP: NASAL | 11 refills | Status: DC

## 2018-01-05 MED ORDER — SUMATRIPTAN SUCCINATE 50 MG PO TABS: ORAL_TABLET | ORAL | 11 refills | Status: DC

## 2018-01-05 MED ORDER — TOPIRAMATE 25 MG PO TABS: ORAL_TABLET | ORAL | 11 refills | Status: DC

## 2018-01-05 MED ORDER — LORATADINE 10 MG PO TABS: 10.0000 mg | ORAL_TABLET | Freq: Every day | ORAL | 11 refills | Status: DC

## 2018-01-05 NOTE — Patient Instructions (Addendum)
Free flu vaccine/covered flu vaccine (bring insurance card if you have one) at flu vaccine clinics:  likely the orange card sign up in September 19th and October 17th for 2 hours sometime between 8:30 a.mm and 11:30 a.m and the Health [facility]:  [facility] next door to clinic on September 28th, Saturday fro 11 a.m to 3 p.m.  Get a Neti pot y [place] di[place] antes de Fluticasone (flonase)  Hypoallergenic pillow covers and mattress cover.   Remove blankets and sheets from bed at least weekly and wash in hot water and dry. Wipe down pillow and mattress covers with lysol wipes and dry before remaking bed. Vacuum any carpeting once to twice weekly.

## 2018-01-05 NOTE — Progress Notes (Signed)
   Subjective:    Patient ID: Natalie Waller, female    DOB: February 01, 1971, 47 y.o.   MRN: [id number]  HPI   1.  Eyes, ears and throat itching every day for months and years.  She is off all her medications to treat allergies again, and also for her migraines.   Difficult to find out what she is concerned about.  She wants more labs to evaluate her symptoms. Has been off meds for 4-5 months.    States she is not working for past 3 years as her vision is blurry.   2.  Migraines:  States she is not having symptoms currently.  Shares her orange card was out of date and this is why she did not get her migraine medication. She states Topiramax and Sumatriptan did not help with her migraines.    3.  Patient is fasting this morning.  Blood glucose is 103.  4.  Due for CPE, no pap  Current Meds  Medication Sig  . Ascorbic Acid (VITAMIN C) 100 MG tablet Take 100 mg by mouth daily.   No Known Allergies Review of Systems     Objective:   Physical Exam Appears fatigued HEENT:  PERRL, EOMI, conjunctivae without injection.  Nasal mucosa boggy with clear discharge.  No erythema.  Posterior pharynx with mild cobbling. TMs pearly gray, but canals dry and a bit flaky in areas. Tender over right eyebrow area and right maxillary area. Neck:  Supple, No adenopathy, no thyromegaly Chest:  CTA CV:  RRR without murmur or rub.  Radial pulses normal and equal. Abd:  S, NT, No HSM or mass. + BS Skin : no rash     Assessment & Plan:   1.  Seasonal and Environmental allergies:  Neti pot to clean sinuses every day Use Flonase subsequently, but not before neti pot Switch oral med to Loratadine 10 mg daily.  2.  Headaches/migraines:  [facility].  This seemed to help in past, though today not clear from patient report that it does. Concerned patient with worries about whether her symptoms are due to something she may have a fear of, but unable to get that info today. She has had  significant evaluation for her symptoms in past by multiple specialists.   Symptoms have changed with time as well. Have tried to also have her assessed by LCSW, but she has not seemed interested in past.  3.  Hyperglycemia:  CMP.  If glucose high, will add A1C  4.  Dyslipidemia:  FLP

## 2018-01-06 LAB — CBC WITH DIFFERENTIAL/PLATELET
BASOS: 1 %
Basophils Absolute: 0.1 10*3/uL (ref 0.0–0.2)
EOS (ABSOLUTE): 0.2 10*3/uL (ref 0.0–0.4)
Eos: 3 %
Hematocrit: 39.8 % (ref 34.0–46.6)
Hemoglobin: 13.2 g/dL (ref 11.1–15.9)
IMMATURE GRANS (ABS): 0 10*3/uL (ref 0.0–0.1)
Immature Granulocytes: 0 %
LYMPHS: 23 %
Lymphocytes Absolute: 1.6 10*3/uL (ref 0.7–3.1)
MCH: 30.8 pg (ref 26.6–33.0)
MCHC: 33.2 g/dL (ref 31.5–35.7)
MCV: 93 fL (ref 79–97)
Monocytes Absolute: 0.5 10*3/uL (ref 0.1–0.9)
Monocytes: 7 %
Neutrophils Absolute: 4.7 10*3/uL (ref 1.4–7.0)
Neutrophils: 66 %
PLATELETS: 277 10*3/uL (ref 150–450)
RBC: 4.28 x10E6/uL (ref 3.77–5.28)
RDW: 13.1 % (ref 12.3–15.4)
WBC: 7.2 10*3/uL (ref 3.4–10.8)

## 2018-01-06 LAB — LIPID PANEL W/O CHOL/HDL RATIO
Cholesterol, Total: 230 mg/dL — ABNORMAL HIGH (ref 100–199)
HDL: 52 mg/dL (ref >39)
LDL Calculated: 152 mg/dL — ABNORMAL HIGH (ref 0–99)
Triglycerides: 128 mg/dL (ref 0–149)
VLDL Cholesterol Cal: 26 mg/dL (ref 5–40)

## 2018-01-06 LAB — COMPREHENSIVE METABOLIC PANEL
ALBUMIN: 4.7 g/dL (ref 3.5–5.5)
ALT: 16 IU/L (ref 0–32)
AST: 21 IU/L (ref 0–40)
Albumin/Globulin Ratio: 1.7 (ref 1.2–2.2)
Alkaline Phosphatase: 62 IU/L (ref 39–117)
BILIRUBIN TOTAL: 0.3 mg/dL (ref 0.0–1.2)
BUN / CREAT RATIO: 14 (ref 9–23)
BUN: 10 mg/dL (ref 6–24)
CHLORIDE: 103 mmol/L (ref 96–106)
CO2: 24 mmol/L (ref 20–29)
Calcium: 9.7 mg/dL (ref 8.7–10.2)
Creatinine, Ser: 0.72 mg/dL (ref 0.57–1.00)
GFR calc non Af Amer: 100 mL/min/{1.73_m2} (ref >59)
GFR, EST AFRICAN AMERICAN: 115 mL/min/{1.73_m2} (ref >59)
Globulin, Total: 2.8 g/dL (ref 1.5–4.5)
Glucose: 91 mg/dL (ref 65–99)
POTASSIUM: 4.7 mmol/L (ref 3.5–5.2)
Sodium: 138 mmol/L (ref 134–144)
TOTAL PROTEIN: 7.5 g/dL (ref 6.0–8.5)

## 2018-01-09 ENCOUNTER — Encounter: Payer: Self-pay | Admitting: Internal Medicine

## 2018-01-09 ENCOUNTER — Other Ambulatory Visit: Payer: Self-pay

## 2018-01-09 MED ORDER — TOPIRAMATE 50 MG PO TABS: ORAL_TABLET | ORAL | 11 refills | Status: DC

## 2018-02-15 ENCOUNTER — Telehealth: Payer: Self-pay | Admitting: Internal Medicine

## 2018-02-15 NOTE — Telephone Encounter (Signed)
Per Dr. Delrae Alfred would like patient to give another month on meds. Please schedule appointment for end of November beginning of December.

## 2018-02-15 NOTE — Telephone Encounter (Signed)
Patient called requesting a sooner appointment than 06/15/18; patient stated is not seeing any improvement with medications Fluticasone, Topiramate and Sumatripan and has been  taking them since last OV on 01/05/18. Patient stated symptoms are worst.  Please advise.

## 2018-02-16 NOTE — Telephone Encounter (Signed)
Patient informed agreed and expressed understanding. Appointment scheduled for 03/30/18.

## 2018-02-16 NOTE — Telephone Encounter (Signed)
Left VM for patient to return call

## 2018-03-30 ENCOUNTER — Encounter: Payer: Self-pay | Admitting: Internal Medicine

## 2018-03-30 ENCOUNTER — Ambulatory Visit: Payer: Self-pay | Admitting: Internal Medicine

## 2018-03-30 VITALS — BP 122/80 | HR 72 | Resp 12 | Ht 59.25 in | Wt 132.0 lb

## 2018-03-30 DIAGNOSIS — G44229 Chronic tension-type headache, not intractable: Secondary | ICD-10-CM

## 2018-03-30 DIAGNOSIS — J3089 Other allergic rhinitis: Secondary | ICD-10-CM

## 2018-03-30 DIAGNOSIS — M542 Cervicalgia: Secondary | ICD-10-CM

## 2018-03-30 DIAGNOSIS — G43009 Migraine without aura, not intractable, without status migrainosus: Secondary | ICD-10-CM

## 2018-03-30 DIAGNOSIS — Z23 Encounter for immunization: Secondary | ICD-10-CM

## 2018-03-30 MED ORDER — FEXOFENADINE HCL 180 MG PO TABS: 180.0000 mg | ORAL_TABLET | Freq: Every day | ORAL | 11 refills | Status: DC

## 2018-03-30 MED ORDER — MONTELUKAST SODIUM 10 MG PO TABS: 10.0000 mg | ORAL_TABLET | Freq: Every day | ORAL | 11 refills | Status: DC

## 2018-03-30 NOTE — Patient Instructions (Signed)
Hypoallergenic pillow and mattress covers. Wipe down once weekly when wash bed coverings

## 2018-03-30 NOTE — Addendum Note (Signed)
Addended by: Marcelino FreestoneHAZLEY, Aedon Deason on: 03/30/2018 11:57 AM   Modules accepted: Orders

## 2018-03-30 NOTE — Progress Notes (Signed)
   Subjective:    Patient ID: Natalie Waller, female    DOB: [birth year], 47 y.o.   MRN: [id number]  HPI   Since last visit, patient [facility].  Discussed obtaining another opinion is fine, but needs to pick one primary care.  Discussed fine to go to another primary, but for her safety and for her primary to be aware of what is going on, she needs to settle on one primary.  She apparently is [facility] with referrals to specialists. [facility].   Has been seen here by Dr. Suszanne Connerseoh, ENT; Dr. Anne HahnWillis, Neurology, Dr. Karleen HampshireSpencer, Ophthalmology.  She states today she would like to stay with [facility] as primary care.    She has had difficulties with her face and head for about 3 years.  Started in January 2016 with a pressure discomfort in her right ear.  States caused her to lose consciousness.  1.  Seasonal and Environmental Allergies:  [facility] and treated with Augmentin 875/125 mg twice daily for 10 days.  She feels this treatment made no difference in her symptoms She is using neti pot twice daily.  Flonase 2 sprays each nostril daily after neti pot. Taking Claritin 10 mg daily.   She does not feel any of this has helped.  She continues to have itching in right eye, ear and maxillary area.   She is having posterior pharyngeal drainage.    Denies any moisture or mold issues in home.  No insect issues as well.  Basement is very dry.    2.  Headaches/migraines:  Taking Topamax 50 mg at bedtime.  No migraines since last seen.   Last used Imitrex, however, about 3 weeks ago.    Current Meds  Medication Sig  . Ascorbic Acid (VITAMIN C) 100 MG tablet Take 100 mg by mouth daily.  . fluticasone (FLONASE) 50 MCG/ACT nasal spray 2 sprays each nostril daily as needed for allergies  . loratadine (CLARITIN) 10 MG tablet Take 1 tablet (10 mg total) by mouth daily.  . SUMAtriptan (IMITREX)  50 MG tablet 1 to 2 tabs by mouth as needed for headache.  May repeat in 2 hours x1 if headache persists or recurs.  Max 200 mg/24 hours  . topiramate (TOPAMAX) 50 MG tablet 1/2 tablet by mouth at bedtime for 2 weeks, then increase to 1 tablet by mouth daily at night.    No Known Allergies Review of Systems     Objective:   Physical Exam NAD HEENT:  PERRL, EOMI, Conjunctivae without injection  Mildly tender over maxillary sinus on right only.  Nasal mucosa normal--perhaps a bit of dryness on right.  Throat without injection or cobbling, but constantly clearing throat during history taking. Neck: Supple, No adenopathy, no thyromegaly Chest:  CTA CV:  RRR without murmur or rub.  Radial and DP pulses normal and equal.        Assessment & Plan:  1.  Right facial complaints:  Currently seems more related to allergies.  In past have questioned trigeminal neuralgia.  Switch Claritin to Allegra 180 mg daily Add Montelukast 10 mg daily Continue Flonase and Neti pot treatments Hypoallergenic pillow and mattress covers  2.  Headaches:  Both tension and migrainous:  Fairly well controlled.  Is continuing with home exercises for neck.  Continue Topomax and intermittent Sumatriptan.

## 2018-06-15 ENCOUNTER — Ambulatory Visit: Payer: Self-pay | Admitting: Internal Medicine

## 2018-06-15 ENCOUNTER — Encounter: Payer: Self-pay | Admitting: Internal Medicine

## 2018-06-15 VITALS — BP 118/82 | HR 66 | Resp 12 | Ht 59.25 in | Wt 125.0 lb

## 2018-06-15 DIAGNOSIS — R739 Hyperglycemia, unspecified: Secondary | ICD-10-CM | POA: Insufficient documentation

## 2018-06-15 DIAGNOSIS — J3089 Other allergic rhinitis: Secondary | ICD-10-CM

## 2018-06-15 DIAGNOSIS — Z1239 Encounter for other screening for malignant neoplasm of breast: Secondary | ICD-10-CM

## 2018-06-15 DIAGNOSIS — Z9189 Other specified personal risk factors, not elsewhere classified: Secondary | ICD-10-CM

## 2018-06-15 DIAGNOSIS — H547 Unspecified visual loss: Secondary | ICD-10-CM

## 2018-06-15 DIAGNOSIS — E782 Mixed hyperlipidemia: Secondary | ICD-10-CM | POA: Insufficient documentation

## 2018-06-15 DIAGNOSIS — J0100 Acute maxillary sinusitis, unspecified: Secondary | ICD-10-CM

## 2018-06-15 DIAGNOSIS — Z0000 Encounter for general adult medical examination without abnormal findings: Secondary | ICD-10-CM

## 2018-06-15 DIAGNOSIS — G43009 Migraine without aura, not intractable, without status migrainosus: Secondary | ICD-10-CM

## 2018-06-15 MED ORDER — PREDNISONE 10 MG PO TABS: ORAL_TABLET | ORAL | 0 refills | Status: DC

## 2018-06-15 MED ORDER — KETOTIFEN FUMARATE 0.025 % OP SOLN: 1.0000 [drp] | Freq: Two times a day (BID) | OPHTHALMIC | 0 refills | Status: DC

## 2018-06-15 MED ORDER — DOXYCYCLINE HYCLATE 100 MG PO TABS: ORAL_TABLET | ORAL | 0 refills | Status: DC

## 2018-06-15 NOTE — Progress Notes (Signed)
Subjective:    Patient ID: Natalie Waller, female   DOB: [birth year], 48 y.o.   MRN: [id number]   HPI   CPE with pap  1.  Pap:  Normal in 05/2016.  Always normal.  No family history of cervical cancer.  2.  Mammogram:  Has never had a mammogram.  Not clear why this did not occur in 2018 when ordered.  No family history of breast cancer.  3.  Osteoprevention:  Drinks soy and almond milk twice daily.  She is willing to increase to 3 times daily for Calcium and vitamin D.  Goes to gym for 1-2 hours 5-6 days weekly.    4.  Guaiac Cards:  Performed in 06/2016 and negative for blood.  5.  Colonoscopy:  Never.  No family history of colon cancer.  6.  Immunizations:   Immunization History  Administered Date(s) Administered  . Influenza Inj Mdck Quad Pf 03/30/2018  . Influenza-Unspecified 02/12/2016  . Tdap 01/03/2017    7.  Glucose/Cholesterol:  History of increased cholesterol in September 2019.  Fasting glucose has been fine in last 3 years.  Lipid Panel     Component Value Date/Time   CHOL 230 (H) 01/05/2018 1018   TRIG 128 01/05/2018 1018   HDL 52 01/05/2018 1018   LDLCALC 152 (H) 01/05/2018 1018   Headaches/facial pain:  Imitrex not helpful when has headache.  Not clear she is really having mainly migrainous pain.   Today, she complains mainly of pain under and behind both eyes.  States she is taking her allergy meds.   She insists there is a blood test done in [place] for her son for an infection in his throat and ear.  She states the infection was a fungus, he was treated and resolved (when he was quite young)  Current Meds  Medication Sig  . Ascorbic Acid (VITAMIN C) 100 MG tablet Take 100 mg by mouth daily.  . fexofenadine (ALLEGRA) 180 MG tablet Take 1 tablet (180 mg total) by mouth daily.  . fluticasone (FLONASE) 50 MCG/ACT nasal spray 2 sprays each nostril daily as needed for allergies  . montelukast (SINGULAIR) 10 MG tablet Take 1 tablet (10 mg total) by mouth  at bedtime.  . topiramate (TOPAMAX) 50 MG tablet 1/2 tablet by mouth at bedtime for 2 weeks, then increase to 1 tablet by mouth daily at night.   No Known Allergies   Past Medical History:  Diagnosis Date  . Environmental and seasonal allergies 01/03/2017  . Headache 2017  . Migraine   . Neck pain 2017  . Tension headache, chronic 04/27/2015    Past Surgical History:  Procedure Laterality Date  . APPENDECTOMY  2009  . CESAREAN SECTION  1997    Family History  Problem Relation Age of Onset  . Hepatitis C Mother    Social History   Socioeconomic History  . Marital status: Married    Spouse name: Alecia LemmingVictor  . Number of children: 1  . Years of education: 419  . Highest education level: Not on file  Occupational History  . Occupation: unemployed     Comment: Previously worked as Education officer, environmentalcleaning in a gym  Social Needs  . Financial resource strain: Not on file  . Food insecurity:    Worry: Never true    Inability: Never true  . Transportation needs:    Medical: No    Non-medical: No  Tobacco Use  . Smoking status: Never Smoker  . Smokeless tobacco:  Never Used  Substance and Sexual Activity  . Alcohol use: No    Alcohol/week: 0.0 standard drinks  . Drug use: No  . Sexual activity: Yes    Birth control/protection: Condom  Lifestyle  . Physical activity:    Days per week: 6 days    Minutes per session: 60 min  . Stress: Not on file  Relationships  . Social connections:    Talks on phone: Not on file    Gets together: Not on file    Attends religious service: Not on file    Active member of club or organization: Not on file    Attends meetings of clubs or organizations: Not on file    Relationship status: Not on file  . Intimate partner violence:    Fear of current or ex partner: No    Emotionally abused: No    Physically abused: No    Forced sexual activity: No  Other Topics Concern  . Not on file  Social History Narrative   C[place]   Married her current  husband, Alecia Lemming May 2017   Lives at home with her husband       Review of Systems  Constitutional: Negative for appetite change and fatigue (fatigued with head discomfort).  HENT: Positive for sinus pressure and sore throat (sometimes). Negative for dental problem, ear pain (Itchy ear bilaterally), postnasal drip, rhinorrhea and sneezing.   Eyes: Positive for itching and visual disturbance (Reading glasses).  Respiratory: Negative for cough and shortness of breath.   Cardiovascular: Negative for chest pain, palpitations and leg swelling.  Gastrointestinal: Negative for abdominal pain, blood in stool (No melena), constipation and diarrhea.  Genitourinary: Negative for dysuria, frequency and menstrual problem.  Musculoskeletal: Negative for arthralgias.  Skin: Negative for rash.  Neurological: Positive for numbness (Right face, at times in right arm with eye issue). Negative for weakness.  Psychiatric/Behavioral: Positive for dysphoric mood (When has facial/head complaints.). The patient is not nervous/anxious.       Objective:   BP 118/82 (BP Location: Left Arm, Patient Position: Sitting, Cuff Size: Normal)   Pulse 66   Resp 12   Ht 4' 11.25" (1.505 m)   Wt 125 lb (56.7 kg)   LMP 06/09/2018   BMI 25.03 kg/m   Physical Exam  Constitutional: She is oriented to person, place, and time. She appears well-developed and well-nourished.  HENT:  Head: Normocephalic and atraumatic.  Right Ear: Hearing, tympanic membrane, external ear and ear canal normal.  Left Ear: Hearing, tympanic membrane, external ear and ear canal normal.  Nose: Mucosal edema present. Right sinus exhibits maxillary sinus tenderness. Right sinus exhibits no frontal sinus tenderness. Left sinus exhibits maxillary sinus tenderness. Left sinus exhibits no frontal sinus tenderness.  Mouth/Throat: Uvula is midline, oropharynx is clear and moist and mucous membranes are normal. No oropharyngeal exudate.  Eyes: Pupils  are equal, round, and reactive to light. Conjunctivae and EOM are normal.  Discs sharp bilaterally.  Neck: Normal range of motion and full passive range of motion without pain. Neck supple. No thyromegaly present.  Cardiovascular: Normal rate, regular rhythm, S1 normal and S2 normal. Exam reveals no S3, no S4 and no friction rub.  No murmur heard. No carotid bruits.  Carotid, radial, femoral, DP and PT pulses normal and equal.   Pulmonary/Chest: Effort normal and breath sounds normal. Right breast exhibits no inverted nipple, no mass, no nipple discharge, no skin change and no tenderness. Left breast exhibits no  inverted nipple, no mass, no nipple discharge, no skin change and no tenderness.  Abdominal: Soft. Bowel sounds are normal. She exhibits no mass. There is no hepatosplenomegaly. There is no abdominal tenderness. No hernia.  Midline lower abdominal surgical scar.  Genitourinary:    Vagina normal.     No vaginal discharge.     Genitourinary Comments: Normal external female genitalia. No uterine or adnexal mass or tenderness. Rectal:  No mass, heme negative light brown stool.   Musculoskeletal: Normal range of motion.  Lymphadenopathy:       Head (right side): No submental and no submandibular adenopathy present.       Head (left side): No submental and no submandibular adenopathy present.    She has no cervical adenopathy.    She has no axillary adenopathy.       Right: No inguinal and no supraclavicular adenopathy present.       Left: No inguinal and no supraclavicular adenopathy present.  Neurological: She is alert and oriented to person, place, and time. She has normal strength and normal reflexes. No cranial nerve deficit. Coordination and gait normal.  Skin: Skin is warm. No rash noted.  Dark brown well circumscribed nevus, smooth at mid upper back just below posterior neckline.  Psychiatric: She has a normal mood and affect. Her speech is normal and behavior is normal. Judgment  and thought content normal. Cognition and memory are normal.     Assessment & Plan   1.  CPE without pap Schedule mammogram She is reminded to call if she does not hear about an appt for mammogram Immunizations up to date. CBC, CMP, FLP  2.  Sinusitis/allergies/migraines/right tension headache with associated neck pain with continued facial and headache complaints.  Patient has been evaluated and treated by ENT, Neurology, ophthalmology, Physical therapy. At one point, seemingly had symptoms controlled, then lost to follow up for a time, stopped meds and returned with continuing problems. Have asked her to try and get to know her symptoms better-when she seems to have issues more related to allergies/sinuses vs migraine symptoms in the future.  Does not seem she is having the tension headaches complaints so much now. Will treat for possible bacterial sinusitis today. Consider referral to allergy in future. She will check with the physician regarding her son's case in years past.  Not clear what she is describing regarding testing.  3.  Eye allergy symptoms:  To try Zaditor.    4.  History of hyperglycemia/hyperlipidemia:  Labs as in #1

## 2018-06-16 LAB — COMPREHENSIVE METABOLIC PANEL
ALK PHOS: 63 IU/L (ref 39–117)
ALT: 15 IU/L (ref 0–32)
AST: 20 IU/L (ref 0–40)
Albumin/Globulin Ratio: 1.7 (ref 1.2–2.2)
Albumin: 4.8 g/dL (ref 3.8–4.8)
BILIRUBIN TOTAL: 0.4 mg/dL (ref 0.0–1.2)
BUN/Creatinine Ratio: 16 (ref 9–23)
BUN: 11 mg/dL (ref 6–24)
CHLORIDE: 98 mmol/L (ref 96–106)
CO2: 23 mmol/L (ref 20–29)
CREATININE: 0.7 mg/dL (ref 0.57–1.00)
Calcium: 9.5 mg/dL (ref 8.7–10.2)
GFR calc Af Amer: 119 mL/min/{1.73_m2} (ref >59)
GFR calc non Af Amer: 104 mL/min/{1.73_m2} (ref >59)
Globulin, Total: 2.8 g/dL (ref 1.5–4.5)
Glucose: 92 mg/dL (ref 65–99)
Potassium: 4.4 mmol/L (ref 3.5–5.2)
Sodium: 145 mmol/L — ABNORMAL HIGH (ref 134–144)
Total Protein: 7.6 g/dL (ref 6.0–8.5)

## 2018-06-16 LAB — CBC WITH DIFFERENTIAL/PLATELET
Basophils Absolute: 0.1 10*3/uL (ref 0.0–0.2)
Basos: 1 %
EOS (ABSOLUTE): 0.4 10*3/uL (ref 0.0–0.4)
EOS: 6 %
HEMATOCRIT: 38.7 % (ref 34.0–46.6)
Hemoglobin: 13.4 g/dL (ref 11.1–15.9)
IMMATURE GRANULOCYTES: 0 %
Immature Grans (Abs): 0 10*3/uL (ref 0.0–0.1)
Lymphocytes Absolute: 1.4 10*3/uL (ref 0.7–3.1)
Lymphs: 25 %
MCH: 31.6 pg (ref 26.6–33.0)
MCHC: 34.6 g/dL (ref 31.5–35.7)
MCV: 91 fL (ref 79–97)
MONOS ABS: 0.6 10*3/uL (ref 0.1–0.9)
Monocytes: 10 %
NEUTROS PCT: 58 %
Neutrophils Absolute: 3.4 10*3/uL (ref 1.4–7.0)
Platelets: 295 10*3/uL (ref 150–450)
RBC: 4.24 x10E6/uL (ref 3.77–5.28)
RDW: 12.8 % (ref 11.7–15.4)
WBC: 5.8 10*3/uL (ref 3.4–10.8)

## 2018-06-16 LAB — LIPID PANEL W/O CHOL/HDL RATIO
CHOLESTEROL TOTAL: 180 mg/dL (ref 100–199)
HDL: 48 mg/dL (ref >39)
LDL CALC: 113 mg/dL — AB (ref 0–99)
TRIGLYCERIDES: 97 mg/dL (ref 0–149)
VLDL CHOLESTEROL CAL: 19 mg/dL (ref 5–40)

## 2018-08-17 ENCOUNTER — Ambulatory Visit: Payer: Self-pay | Admitting: Internal Medicine

## 2018-09-27 ENCOUNTER — Other Ambulatory Visit: Payer: Self-pay

## 2018-09-27 ENCOUNTER — Ambulatory Visit: Payer: Self-pay | Admitting: Internal Medicine

## 2018-09-27 ENCOUNTER — Encounter: Payer: Self-pay | Admitting: Internal Medicine

## 2018-09-27 VITALS — BP 122/70 | HR 72 | Resp 12 | Ht 59.25 in | Wt 126.0 lb

## 2018-09-27 DIAGNOSIS — J3089 Other allergic rhinitis: Secondary | ICD-10-CM

## 2018-09-27 NOTE — Patient Instructions (Signed)
Continue current medications until we can get you into allergist

## 2018-09-27 NOTE — Progress Notes (Signed)
Subjective:    Patient ID: Natalie Waller, female   DOB: [birth year], 48 y.o.   MRN: [id number]   HPI   Sinus/allergies/migraines/tension headaches:  Last seen in February and treated for bacterial sinusitis and asked patient to pay attention to her symptoms to get a better idea what is actually going on.   She has had significant evaluations from multiple specialists regarding these complaints.  She states her symptoms did not improve with the antibiotics prescribed in February. She states the itching in her ears/eyes and the pressure in her head did not improve.   No posterior pharyngeal drainage.  She is actually not taking the Allegra currently.  She has been taking Zyrtec every day since sometime in April.  Taking Montelukast 10 mg daily. Using Fluticasone nasal spray 2 sprays each nostril daily.   States she is using all regularly and has not missed. She continues to use Zaditor and does not feel it is helping as well.   Feels under her eyes is swollen.  No concerns for mold or mildew or insect problem in her house.   No pets. Has some trees in yard.  Does not feel pollen is a problem in her yard.   Not much in way of carpeting in home.  Washes sheets and covers weekly Cleans home regularly. Has hypoallergenic covers for pillows and mattress. Not having any fevers.     Current Meds  Medication Sig  . Ascorbic Acid (VITAMIN C) 100 MG tablet Take 100 mg by mouth daily.  . fexofenadine (ALLEGRA) 180 MG tablet Take 1 tablet (180 mg total) by mouth daily.  . fluticasone (FLONASE) 50 MCG/ACT nasal spray 2 sprays each nostril daily as needed for allergies  . ketotifen (ZADITOR) 0.025 % ophthalmic solution Place 1 drop into both eyes 2 (two) times daily.  . montelukast (SINGULAIR) 10 MG tablet Take 1 tablet (10 mg total) by mouth at bedtime.  . SUMAtriptan (IMITREX) 50 MG tablet 1 to 2 tabs by mouth as needed for headache.  May repeat in 2 hours x1 if headache persists or  recurs.  Max 200 mg/24 hours   No Known Allergies   Review of Systems    Objective:   BP 122/70 (BP Location: Left Arm, Patient Position: Sitting, Cuff Size: Normal)   Pulse 72   Resp 12   Ht 4' 11.25" (1.505 m)   Wt 126 lb (57.2 kg)   LMP 09/07/2018   BMI 25.23 kg/m    Physical Exam  NAD HEENT:  PERRL, Bags under eyes, right more so than left.  Tender over maxillary sinuses, again right>>left.  Right nasal mucosa swollen with clear discharge.  Cobbling of posterior pharynx.  TMs pearly gray. Neck:  Supple, No adenopathy Chest:  CTA CV:  RRR without murmur or rub.  Radial pulses normal and equal.      Assessment & Plan  Chronic right sided head/facial complaints:  Patient has been seen by multiple specialists for this is past.   Her current symptoms appear to be related to allergy symptoms, though on multiple medications for this. At this point, seems best way to proceed is to have an allergy evaluation.  Not sure if she would be able to undergo immunotherapy due to cost. Will see if can get referral to Allergist via orange card, but if not--will need to look at [facility] or [facility].   Have asked her to continue her current meds or consider switching out her oral antihistamine  to see if any improvement.

## 2019-02-01 ENCOUNTER — Ambulatory Visit: Payer: Self-pay | Admitting: Internal Medicine

## 2019-02-08 ENCOUNTER — Ambulatory Visit: Payer: Self-pay | Admitting: Neurology

## 2019-02-08 ENCOUNTER — Encounter: Payer: Self-pay | Admitting: Neurology

## 2019-02-08 ENCOUNTER — Other Ambulatory Visit: Payer: Self-pay

## 2019-02-08 VITALS — BP 110/73 | HR 90 | Temp 97.8°F | Ht 59.25 in | Wt 126.8 lb

## 2019-02-08 DIAGNOSIS — G44229 Chronic tension-type headache, not intractable: Secondary | ICD-10-CM

## 2019-02-08 DIAGNOSIS — L299 Pruritus, unspecified: Secondary | ICD-10-CM | POA: Insufficient documentation

## 2019-02-08 DIAGNOSIS — M542 Cervicalgia: Secondary | ICD-10-CM

## 2019-02-08 MED ORDER — IMIPRAMINE HCL 25 MG PO TABS: ORAL_TABLET | ORAL | 5 refills | Status: DC

## 2019-02-08 NOTE — Progress Notes (Signed)
[facility]  PATIENT: Natalie Waller DOB: 02/18/1971  REFERRING DOCTOR OR PCP:  Georga Bora SOURCE: Patient through interpreter, notes from primary care, imaging reports, MRI images personally reviewed.  _________________________________   HISTORICAL  CHIEF COMPLAINT:  Chief Complaint  Patient presents with  . New Patient (Initial Visit)    RM 13 w/ spanish interpreter (temp: 97.6).  Paper referral from Michail Jewels, NP for headaches.   . Headache    Has headaches/pressure in back of head. Has daily blurry vision that is constant. Has some swelling under eyes. This started about 3 years ago. has had MRI in the past done around 2 years ago. Has itching in eyes/ears and denies it being allergies. Has some phlegm in throat, no cough.     HISTORY OF PRESENT ILLNESS:  At the pleasure seeing your patient, Natalie Waller, a [facility] for neurologic consultation regarding her chronic headaches and neck pain.  She is a 48 year old woman with headaches that are mostly occipital for the past three years.   The quality is pressure-like with occasional pulsing sensations.    Sometimes she feels dizzy when the headache is worse.  Moving her head does not alter pain much.  She has no nausea or vomiting.    The headache is daily but fluctuates.   Nothing seems to make it better or worse.  Prescription strength ibuprofen had not helped.   She has not had any prescription   When the pain is worse she notes her vision is blurry.   She also can't  Sleep when pain is worse.    She feels a swollen sensation under her eyes.  She also feels an itching sensation in her ears and they sometimes pop from pressure.   She reports that no abnormalities were seen by ophthalmology in the past.  She also reports having her ileus looked into it without any abnormality noted.  I personally reviewed the MRI of the brain performed 09/18/2015.  It shows some  scattered T2/flair hypertense foci, mostly subcortical white matter.  None of the foci appear to be acute and they do not enhance.    REVIEW OF SYSTEMS: Constitutional: No fevers, chills, sweats, or change in appetite Eyes: No visual changes, double vision, eye pain Ear, nose and throat: No hearing loss, ear pain, nasal congestion, sore throat Cardiovascular: No chest pain, palpitations Respiratory: No shortness of breath at rest or with exertion.   No wheezes GastrointestinaI: No nausea, vomiting, diarrhea, abdominal pain, fecal incontinence Genitourinary: No dysuria, urinary retention or frequency.  No nocturia. Musculoskeletal: No neck pain, back pain Integumentary: No rash, pruritus, skin lesions Neurological: as above Psychiatric: No depression at this time.  No anxiety Endocrine: No palpitations, diaphoresis, change in appetite, change in weigh or increased thirst Hematologic/Lymphatic: No anemia, purpura, petechiae. Allergic/Immunologic: No itchy/runny eyes, nasal congestion, recent allergic reactions, rashes  ALLERGIES: No Known Allergies  HOME MEDICATIONS:  Current Outpatient Medications:  .  Ascorbic Acid (VITAMIN C) 100 MG tablet, Take 100 mg by mouth daily., Disp: , Rfl:  .  imipramine (TOFRANIL) 25 MG tablet, One po 1 hour before bedtime, Disp: 30 tablet, Rfl: 5  PAST MEDICAL HISTORY: Past Medical History:  Diagnosis Date  . Environmental and seasonal allergies 01/03/2017  . Headache 2017  . Migraine   . Neck pain 2017  . Tension headache, chronic 04/27/2015    PAST SURGICAL HISTORY: Past Surgical History:  Procedure Laterality Date  . APPENDECTOMY  2009  . CESAREAN SECTION  1997    FAMILY HISTORY: Family History  Problem Relation Age of Onset  . Hepatitis C Mother     SOCIAL HISTORY:  Social History   Socioeconomic History  . Marital status: Married    Spouse name: Alecia Lemming  . Number of children: 1  . Years of education: 81  . Highest education  level: Not on file  Occupational History  . Occupation: unemployed     Comment: Previously worked as Education officer, environmental in a gym  Social Needs  . Financial resource strain: Not on file  . Food insecurity    Worry: Never true    Inability: Never true  . Transportation needs    Medical: No    Non-medical: No  Tobacco Use  . Smoking status: Never Smoker  . Smokeless tobacco: Never Used  Substance and Sexual Activity  . Alcohol use: No    Alcohol/week: 0.0 standard drinks  . Drug use: No  . Sexual activity: Yes    Birth control/protection: Condom  Lifestyle  . Physical activity    Days per week: 6 days    Minutes per session: 60 min  . Stress: Not on file  Relationships  . Social Musician on phone: Not on file    Gets together: Not on file    Attends religious service: Not on file    Active member of club or organization: Not on file    Attends meetings of clubs or organizations: Not on file    Relationship status: Not on file  . Intimate partner violence    Fear of current or ex partner: No    Emotionally abused: No    Physically abused: No    Forced sexual activity: No  Other Topics Concern  . Not on file  Social History Narrative   [place] to [place] In [place]   Married her current husband, Alecia Lemming May 2017   Lives at home with her husband   Caffeine use: very rare   Right handed     PHYSICAL EXAM  Vitals:   02/08/19 1031  BP: 110/73  Pulse: 90  Temp: 97.8 F (36.6 C)  Weight: 126 lb 12.8 oz (57.5 kg)  Height: 4' 11.25" (1.505 m)    Body mass index is 25.39 kg/m.   General: The patient is well-developed and well-nourished and in no acute distress  HEENT:  Head is [place]/AT.  Sclera are anicteric.  Funduscopic exam shows normal optic discs and retinal vessels.  Tympanic membranes are intact.  Ear canals were normal.  Neck: No carotid bruits are noted.  She is tender over the splenius capitis muscle/occipital nerves bilaterally.  Cardiovascular: The heart has  a regular rate and rhythm with a normal S1 and S2. There were no murmurs, gallops or rubs.    Skin: Extremities are without rash or  edema.  Musculoskeletal:  Back is nontender  Neurologic Exam  Mental status: The patient is alert and oriented x 3 at the time of the examination. The patient has apparent normal recent and remote memory, with an apparently normal attention span and concentration ability.   Speech is normal.  Cranial nerves: Extraocular movements are full. Pupils are equal, round, and reactive to light and accomodation.  Visual fields are full.  Facial symmetry is present. There is good facial sensation to soft touch bilaterally.Facial strength is normal.  Trapezius and sternocleidomastoid strength is normal. No dysarthria is noted.  The tongue is midline, and  the patient has symmetric elevation of the soft palate. No obvious hearing deficits are noted.  Motor:  Muscle bulk is normal.   Tone is normal. Strength is  5 / 5 in all 4 extremities.   Sensory: Sensory testing is intact to pinprick, soft touch and vibration sensation in all 4 extremities.  Coordination: Cerebellar testing reveals good finger-nose-finger and heel-to-shin bilaterally.  Gait and station: Station is normal.   Gait is normal. Tandem gait is normal. Romberg is negative.   Reflexes: Deep tendon reflexes are symmetric and normal bilaterally.   Plantar responses are flexor.    DIAGNOSTIC DATA (LABS, IMAGING, TESTING) - I reviewed patient records, labs, notes, testing and imaging myself where available.  Lab Results  Component Value Date   WBC 5.8 06/15/2018   HGB 13.4 06/15/2018   HCT 38.7 06/15/2018   MCV 91 06/15/2018   PLT 295 06/15/2018      Component Value Date/Time   NA 145 (H) 06/15/2018 1011   K 4.4 06/15/2018 1011   CL 98 06/15/2018 1011   CO2 23 06/15/2018 1011   GLUCOSE 92 06/15/2018 1011   GLUCOSE 105 (H) 08/28/2015 1911   BUN 11 06/15/2018 1011   CREATININE 0.70 06/15/2018 1011    CALCIUM 9.5 06/15/2018 1011   PROT 7.6 06/15/2018 1011   ALBUMIN 4.8 06/15/2018 1011   AST 20 06/15/2018 1011   ALT 15 06/15/2018 1011   ALKPHOS 63 06/15/2018 1011   BILITOT 0.4 06/15/2018 1011   GFRNONAA 104 06/15/2018 1011   GFRAA 119 06/15/2018 1011   Lab Results  Component Value Date   CHOL 180 06/15/2018   HDL 48 06/15/2018   LDLCALC 113 (H) 06/15/2018   TRIG 97 06/15/2018       ASSESSMENT AND PLAN  Chronic tension-type headache, not intractable  Neck pain  Ear itching   In summary, Ms. Marin CommentDiaz Perez is a 48 year old woman with chronic headache for the past 2 to 3 years.  She does not have migrainous features with the headaches.  On examination, she has tenderness at the occiput bilaterally.  The neurologic examination is normal.  MRI in the past had shown some scattered T2/FLAIR hyperintense foci.  These are nonspecific.  Help with the pain, I will start her on imipramine 25 mg nightly.  I offered to do an occipital nerve block as it might help to relieve pain more rapidly but she prefers not to have that at this time. If not better, consider obtaining an MRI and consider change from imipramine to gabapentin 300 tid or other agent.  I do not have a good explanation for the itching sensation in her ears.  The exam appeared normal today.  Perhaps it is related to seasonal allergies.  The itching sensation is unlikely to be related to her headaches or the abnormal brain MRI.  She will call us back in 1 month if there is no improvement.  I will see her back in 3 months for regular visit but she should call sooner if there are new or worsening neurologic symptoms.  Thank you for asking me to see Ms. Marin CommentDiaz Perez for a neurologic consultation.  Please have nobody be of further assistance with her or other patients in the future.    Onelia Cadmus A. Epimenio FootSater, MD, [facility] Center[facility],FAAN 02/08/2019, 8:57 PM Certified in Neurology, Clinical Neurophysiology, Sleep Medicine and Neuroimaging  [facility] [address], [address] [place], [place] [id number] [phone]

## 2019-03-07 ENCOUNTER — Ambulatory Visit (INDEPENDENT_AMBULATORY_CARE_PROVIDER_SITE_OTHER): Payer: Self-pay | Admitting: Primary Care

## 2019-03-07 ENCOUNTER — Encounter (INDEPENDENT_AMBULATORY_CARE_PROVIDER_SITE_OTHER): Payer: Self-pay | Admitting: Primary Care

## 2019-03-07 ENCOUNTER — Other Ambulatory Visit: Payer: Self-pay

## 2019-03-07 DIAGNOSIS — J302 Other seasonal allergic rhinitis: Secondary | ICD-10-CM

## 2019-03-07 DIAGNOSIS — J3089 Other allergic rhinitis: Secondary | ICD-10-CM

## 2019-03-07 DIAGNOSIS — Z7689 Persons encountering health services in other specified circumstances: Secondary | ICD-10-CM

## 2019-03-07 DIAGNOSIS — G43009 Migraine without aura, not intractable, without status migrainosus: Secondary | ICD-10-CM

## 2019-03-07 NOTE — Progress Notes (Signed)
Virtual Visit via Telephone Note  I connected with Natalie Waller on 03/07/19 at 10:30 AM EST by telephone and verified that I am speaking with the correct person using two identifiers.   I discussed the limitations, risks, security and privacy concerns of performing an evaluation and management service by telephone and the availability of in person appointments. I also discussed with the patient that there may be a patient responsible charge related to this service. The patient expressed understanding and agreed to proceed.   History of Present Illness: Natalie Waller is establishing care. She is concern of ear pain and itchy eyes previous provider treated her with allergy medication and does not help. This problem has been taking place for 3 years. States her right eye is " inflammated"  asked to explain what that meant right eye is swollen and red. Bottom lid of her eye swollen/red bump. Ear pain no hearing loss. Hair pen in ear. Open mouth wide to hear poping  Past Medical History:  Diagnosis Date  . Environmental and seasonal allergies 01/03/2017  . Headache 2017  . Migraine   . Neck pain 2017  . Tension headache, chronic 04/27/2015     Observations/Objective: Review of Systems  Neurological: Positive for headaches.  Endo/Heme/Allergies: Positive for environmental allergies.  All other systems reviewed and are negative.   Assessment and Plan: Natalie Waller was seen today for new patient (initial visit).  Diagnoses and all orders for this visit:  Environmental and seasonal allergies Patient has tried every over the counter antihistamine- advised to try antihistamine with D component (with pseudoephedrine) 12hr twice daily or 24hr once   Migraine without aura and without status migrainosus, not intractable Discuss triggers can be caused by drinking alcohol smoking or certain medications, eating and drinking certain products  This condition may be triggered or caused by:  Caffeine, aged cheese, and chocolate.  Encounter to establish care Natalie Mire, NP-C will be your  (PCP) mastered prepared that is able to that will  diagnosed and treatment able to answer health concern as well as continuing care of varied medical conditions, not limited by cause, organ system, or diagnosis.     Follow Up Instructions:    I discussed the assessment and treatment plan with the patient. The patient was provided an opportunity to ask questions and all were answered. The patient agreed with the plan and demonstrated an understanding of the instructions.   The patient was advised to call back or seek an in-person evaluation if the symptoms worsen or if the condition fails to improve as anticipated.  I provided 20  minutes of non-face-to-face time during this encounter.   Kerin Perna, NP

## 2019-03-07 NOTE — Progress Notes (Signed)
Issues with ears and eyes- itching in both not allergy related.

## 2019-03-19 ENCOUNTER — Other Ambulatory Visit: Payer: Self-pay

## 2019-03-19 ENCOUNTER — Ambulatory Visit: Payer: Self-pay | Attending: Family Medicine

## 2019-04-10 ENCOUNTER — Telehealth (INDEPENDENT_AMBULATORY_CARE_PROVIDER_SITE_OTHER): Payer: Self-pay

## 2019-04-10 NOTE — Telephone Encounter (Signed)
Sent to PCP ?

## 2019-04-10 NOTE — Telephone Encounter (Signed)
Patient is request to have a dental referral states that one of molars are broken. Patient was advice patient could be seen at [facility] with a urgent referral.   Please send referral if appropriate.

## 2019-04-11 ENCOUNTER — Other Ambulatory Visit (INDEPENDENT_AMBULATORY_CARE_PROVIDER_SITE_OTHER): Payer: Self-pay | Admitting: Primary Care

## 2019-04-11 DIAGNOSIS — S025XXA Fracture of tooth (traumatic), initial encounter for closed fracture: Secondary | ICD-10-CM

## 2019-04-11 NOTE — Telephone Encounter (Signed)
Sent dental referral

## 2019-05-13 NOTE — Progress Notes (Deleted)
PATIENT: Natalie Waller DOB: 11-Jul-1970  REASON FOR VISIT: follow up HISTORY FROM: patient  No chief complaint on file.    HISTORY OF PRESENT ILLNESS: Today 05/13/19 Natalie Waller is a 49 y.o. female here today for follow up.   HISTORY: (copied from Dr Bonnita Hollow note on 02/08/2019)  At the pleasure seeing your patient, Natalie Waller, a [facility] for neurologic consultation regarding her chronic headaches and neck pain.  She is a 49 year old woman with headaches that are mostly occipital for the past three years.   The quality is pressure-like with occasional pulsing sensations.    Sometimes she feels dizzy when the headache is worse.  Moving her head does not alter pain much.  She has no nausea or vomiting.    The headache is daily but fluctuates.   Nothing seems to make it better or worse.  Prescription strength ibuprofen had not helped.   She has not had any prescription   When the pain is worse she notes her vision is blurry.   She also can't  Sleep when pain is worse.    She feels a swollen sensation under her eyes.  She also feels an itching sensation in her ears and they sometimes pop from pressure.   She reports that no abnormalities were seen by ophthalmology in the past.  She also reports having her ileus looked into it without any abnormality noted.  I personally reviewed the MRI of the brain performed 09/18/2015.  It shows some scattered T2/flair hypertense foci, mostly subcortical white matter.  None of the foci appear to be acute and they do not enhance.   REVIEW OF SYSTEMS: Out of a complete 14 system review of symptoms, the patient complains only of the following symptoms, and all other reviewed systems are negative.  ALLERGIES: No Known Allergies  HOME MEDICATIONS: No outpatient medications prior to visit.   No facility-administered medications prior to visit.    PAST MEDICAL HISTORY: Past Medical History:  Diagnosis Date   . Environmental and seasonal allergies 01/03/2017  . Headache 2017  . Migraine   . Neck pain 2017  . Tension headache, chronic 04/27/2015    PAST SURGICAL HISTORY: Past Surgical History:  Procedure Laterality Date  . APPENDECTOMY  2009  . CESAREAN SECTION  1997    FAMILY HISTORY: Family History  Problem Relation Age of Onset  . Hepatitis C Mother     SOCIAL HISTORY: Social History   Socioeconomic History  . Marital status: Married    Spouse name: Alecia Lemming  . Number of children: 1  . Years of education: 58  . Highest education level: Not on file  Occupational History  . Occupation: unemployed     Comment: Previously worked as Education officer, environmental in a gym  Tobacco Use  . Smoking status: Never Smoker  . Smokeless tobacco: Never Used  Substance and Sexual Activity  . Alcohol use: No    Alcohol/week: 0.0 standard drinks  . Drug use: No  . Sexual activity: Yes    Birth control/protection: Condom  Other Topics Concern  . Not on file  Social History Narrative   [place] In [place]   Married her current husband, Alecia Lemming May 2017   Lives at home with her husband   Caffeine use: very rare   Right handed   Social Determinants of Health   Financial Resource Strain:   . Difficulty of Paying Living Expenses: Not on file  Food Insecurity:  No Food Insecurity  . Worried About Programme researcher, broadcasting/film/video in the Last Year: Never true  . Ran Out of Food in the Last Year: Never true  Transportation Needs: No Transportation Needs  . Lack of Transportation (Medical): No  . Lack of Transportation (Non-Medical): No  Physical Activity: Sufficiently Active  . Days of Exercise per Week: 6 days  . Minutes of Exercise per Session: 60 min  Stress:   . Feeling of Stress : Not on file  Social Connections:   . Frequency of Communication with Friends and Family: Not on file  . Frequency of Social Gatherings with Friends and Family: Not on file  . Attends Religious Services: Not on file  . Active Member of  Clubs or Organizations: Not on file  . Attends Banker Meetings: Not on file  . Marital Status: Not on file  Intimate Partner Violence: Not At Risk  . Fear of Current or Ex-Partner: No  . Emotionally Abused: No  . Physically Abused: No  . Sexually Abused: No      PHYSICAL EXAM  There were no vitals filed for this visit. There is no height or weight on file to calculate BMI.  Generalized: Well developed, in no acute distress  Cardiology: normal rate and rhythm, no murmur noted Neurological examination  Mentation: Alert oriented to time, place, history taking. Follows all commands speech and language fluent Cranial nerve II-XII: Pupils were equal round reactive to light. Extraocular movements were full, visual field were full on confrontational test. Facial sensation and strength were normal. Uvula tongue midline. Head turning and shoulder shrug  were normal and symmetric. Motor: The motor testing reveals 5 over 5 strength of all 4 extremities. Good symmetric motor tone is noted throughout.  Sensory: Sensory testing is intact to soft touch on all 4 extremities. No evidence of extinction is noted.  Coordination: Cerebellar testing reveals good finger-nose-finger and heel-to-shin bilaterally.  Gait and station: Gait is normal. Tandem gait is normal. Romberg is negative. No drift is seen.  Reflexes: Deep tendon reflexes are symmetric and normal bilaterally.   DIAGNOSTIC DATA (LABS, IMAGING, TESTING) - I reviewed patient records, labs, notes, testing and imaging myself where available.  No flowsheet data found.   Lab Results  Component Value Date   WBC 5.8 06/15/2018   HGB 13.4 06/15/2018   HCT 38.7 06/15/2018   MCV 91 06/15/2018   PLT 295 06/15/2018      Component Value Date/Time   NA 145 (H) 06/15/2018 1011   K 4.4 06/15/2018 1011   CL 98 06/15/2018 1011   CO2 23 06/15/2018 1011   GLUCOSE 92 06/15/2018 1011   GLUCOSE 105 (H) 08/28/2015 1911   BUN 11  06/15/2018 1011   CREATININE 0.70 06/15/2018 1011   CALCIUM 9.5 06/15/2018 1011   PROT 7.6 06/15/2018 1011   ALBUMIN 4.8 06/15/2018 1011   AST 20 06/15/2018 1011   ALT 15 06/15/2018 1011   ALKPHOS 63 06/15/2018 1011   BILITOT 0.4 06/15/2018 1011   GFRNONAA 104 06/15/2018 1011   GFRAA 119 06/15/2018 1011   Lab Results  Component Value Date   CHOL 180 06/15/2018   HDL 48 06/15/2018   LDLCALC 113 (H) 06/15/2018   TRIG 97 06/15/2018   No results found for: HGBA1C Lab Results  Component Value Date   VITAMINB12 >2000 (H) 09/02/2015   No results found for: TSH     ASSESSMENT AND PLAN 49 y.o. year old female  has a  past medical history of Environmental and seasonal allergies (01/03/2017), Headache (2017), Migraine, Neck pain (2017), and Tension headache, chronic (04/27/2015). here with ***    ICD-10-CM   1. Chronic tension-type headache, not intractable  G44.229   2. Neck pain  M54.2        No orders of the defined types were placed in this encounter.    No orders of the defined types were placed in this encounter.     I spent 15 minutes with the patient. 50% of this time was spent counseling and educating patient on plan of care and medications.    Debbora Presto, FNP-C 05/13/2019, 6:14 PM [facility] [address], [place], [place] [address] [phone]

## 2019-05-14 ENCOUNTER — Encounter: Payer: Self-pay | Admitting: Family Medicine

## 2019-05-14 ENCOUNTER — Ambulatory Visit: Payer: Self-pay | Admitting: Family Medicine

## 2019-05-14 ENCOUNTER — Telehealth: Payer: Self-pay

## 2019-05-14 NOTE — Telephone Encounter (Signed)
Patient was a no call/no show for their appointment today.   

## 2019-05-31 ENCOUNTER — Ambulatory Visit (INDEPENDENT_AMBULATORY_CARE_PROVIDER_SITE_OTHER): Payer: No Typology Code available for payment source | Admitting: Primary Care

## 2019-06-05 ENCOUNTER — Other Ambulatory Visit: Payer: Self-pay

## 2019-06-05 ENCOUNTER — Ambulatory Visit (INDEPENDENT_AMBULATORY_CARE_PROVIDER_SITE_OTHER): Payer: No Typology Code available for payment source | Admitting: Primary Care

## 2019-06-05 ENCOUNTER — Encounter (INDEPENDENT_AMBULATORY_CARE_PROVIDER_SITE_OTHER): Payer: Self-pay | Admitting: Primary Care

## 2019-06-05 DIAGNOSIS — J3089 Other allergic rhinitis: Secondary | ICD-10-CM

## 2019-06-05 MED ORDER — LORATADINE 10 MG PO TABS: 10.0000 mg | ORAL_TABLET | Freq: Every day | ORAL | 11 refills | Status: DC

## 2019-06-05 NOTE — Progress Notes (Signed)
Virtual Visit via Telephone Note  I connected with Natalie Waller on 06/05/19 at 11:10 AM EST by telephone and verified that I am speaking with the correct person using two identifiers.   I discussed the limitations, risks, security and privacy concerns of performing an evaluation and management service by telephone and the availability of in person appointments. I also discussed with the patient that there may be a patient responsible charge related to this service. The patient expressed understanding and agreed to proceed.   History of Present Illness: Natalie Waller having a tele visit for itchy eyes and ears going on for 3 years. No one has done anything about . Question was she taking antihistamine yes that's the only thing they will give me.  Also had complaints of headache , however has a scheduled appointment with neurologist Past Medical History:  Diagnosis Date  . Environmental and seasonal allergies 01/03/2017  . Headache 2017  . Migraine   . Neck pain 2017  . Tension headache, chronic 04/27/2015   No current outpatient medications on file prior to visit.   No current facility-administered medications on file prior to visit.   Observations/Objective: Review of Systems  HENT:       Itchy eyes and ears for 3 years continue antihistamine   Neurological: Positive for headaches.       Appointment schedule with neurologist   All other systems reviewed and are negative.  Assessment and Plan: [name] was seen today for headache.  Diagnoses and all orders for this visit:  Environmental and seasonal allergies Symptoms of watery eyes and itching ears for 3 years will order antihistamines.   Unknown etiology.   Follow Up Instructions:    I discussed the assessment and treatment plan with the patient. The patient was provided an opportunity to ask questions and all were answered. The patient agreed with the plan and demonstrated an understanding of the instructions.    The patient was advised to call back or seek an in-person evaluation if the symptoms worsen or if the condition fails to improve as anticipated.  I provided 10  minutes of non-face-to-face time during this encounter.   [name], NP

## 2019-06-05 NOTE — Progress Notes (Signed)
Itching on ears and eyes  Popping sound in ears

## 2019-06-12 NOTE — Progress Notes (Signed)
PATIENT: Natalie Waller DOB: 11-11-1970  REASON FOR VISIT: follow up HISTORY FROM: patient  Chief Complaint  Patient presents with  . Follow-up    3 mon f/u. Alone. Rm 1. No new concerns at this time.      HISTORY OF PRESENT ILLNESS: Today 06/13/19 [name] is a 49 y.o. female here today for follow up for headaches. She was started on imipramine 25mg  at bedtime. She does not feel that it has helped at all. She reports a pressure sensation behind her eyes that occurs most days. No migraine symptoms. She has "inflammation" under her eyes. Her ears itch and she feels that something is moving in her ears. She also notes tension of her neck and shoulders. She exercises daily. She denies vision changes.   HISTORY: (copied from Dr Garth Bigness note on 02/08/2019)  At the pleasure seeing your patient, Amya Hlad, a [facility] for neurologic consultation regarding her chronic headaches and neck pain.  She is a 49 year old woman with headaches that are mostly occipital for the past three years.   The quality is pressure-like with occasional pulsing sensations.    Sometimes she feels dizzy when the headache is worse.  Moving her head does not alter pain much.  She has no nausea or vomiting.    The headache is daily but fluctuates.   Nothing seems to make it better or worse.  Prescription strength ibuprofen had not helped.   She has not had any prescription   When the pain is worse she notes her vision is blurry.   She also can't  Sleep when pain is worse.    She feels a swollen sensation under her eyes.  She also feels an itching sensation in her ears and they sometimes pop from pressure.   She reports that no abnormalities were seen by ophthalmology in the past.  She also reports having her ileus looked into it without any abnormality noted.  I personally reviewed the MRI of the brain performed 09/18/2015.  It shows some scattered T2/flair hypertense foci,  mostly subcortical white matter.  None of the foci appear to be acute and they do not enhance.    REVIEW OF SYSTEMS: Out of a complete 14 system review of symptoms, the patient complains only of the following symptoms, allergies, neck pain, headaches and all other reviewed systems are negative.  ALLERGIES: No Known Allergies  HOME MEDICATIONS: Outpatient Medications Prior to Visit  Medication Sig Dispense Refill  . loratadine (CLARITIN) 10 MG tablet Take 1 tablet (10 mg total) by mouth daily. (Patient not taking: Reported on 06/13/2019) 30 tablet 11   No facility-administered medications prior to visit.    PAST MEDICAL HISTORY: Past Medical History:  Diagnosis Date  . Environmental and seasonal allergies 01/03/2017  . Headache 2017  . Migraine   . Neck pain 2017  . Tension headache, chronic 04/27/2015    PAST SURGICAL HISTORY: Past Surgical History:  Procedure Laterality Date  . APPENDECTOMY  2009  . CESAREAN SECTION  1997    FAMILY HISTORY: Family History  Problem Relation Age of Onset  . Hepatitis C Mother     SOCIAL HISTORY: Social History   Socioeconomic History  . Marital status: Married    Spouse name: Christy Sartorius  . Number of children: 1  . Years of education: 4  . Highest education level: Not on file  Occupational History  . Occupation: unemployed     Comment: Previously worked  as cleaning in a gym  Tobacco Use  . Smoking status: Never Smoker  . Smokeless tobacco: Never Used  Substance and Sexual Activity  . Alcohol use: No    Alcohol/week: 0.0 standard drinks  . Drug use: No  . Sexual activity: Yes    Birth control/protection: Condom  Other Topics Concern  . Not on file  Social History Narrative   [place] In [place]   Married her current husband, Alecia Lemming May 2017   Lives at home with her husband   Caffeine use: very rare   Right handed   Social Determinants of Health   Financial Resource Strain:   . Difficulty of Paying Living Expenses: Not  on file  Food Insecurity: No Food Insecurity  . Worried About Programme researcher, broadcasting/film/video in the Last Year: Never true  . Ran Out of Food in the Last Year: Never true  Transportation Needs: No Transportation Needs  . Lack of Transportation (Medical): No  . Lack of Transportation (Non-Medical): No  Physical Activity: Sufficiently Active  . Days of Exercise per Week: 6 days  . Minutes of Exercise per Session: 60 min  Stress:   . Feeling of Stress : Not on file  Social Connections:   . Frequency of Communication with Friends and Family: Not on file  . Frequency of Social Gatherings with Friends and Family: Not on file  . Attends Religious Services: Not on file  . Active Member of Clubs or Organizations: Not on file  . Attends Banker Meetings: Not on file  . Marital Status: Not on file  Intimate Partner Violence: Not At Risk  . Fear of Current or Ex-Partner: No  . Emotionally Abused: No  . Physically Abused: No  . Sexually Abused: No      PHYSICAL EXAM  Vitals:   06/13/19 0713  BP: 112/72  Pulse: 73  Temp: (!) 96.9 F (36.1 C)  TempSrc: Oral  Weight: 128 lb (58.1 kg)  Height: 4' 11.5" (1.511 m)   Body mass index is 25.42 kg/m.  Generalized: Well developed, in no acute distress  Cardiology: normal rate and rhythm, no murmur noted Neurological examination  Mentation: Alert oriented to time, place, history taking. Follows all commands speech and language fluent Cranial nerve II-XII: Pupils were equal round reactive to light. Extraocular movements were full, visual field were full on confrontational test. Facial sensation and strength were normal. Uvula tongue midline. Head turning and shoulder shrug  were normal and symmetric. Motor: The motor testing reveals 5 over 5 strength of all 4 extremities. Good symmetric motor tone is noted throughout. Patient reports pain in bilateral trapezius muscles with palpation  Sensory: Sensory testing is intact to soft touch on all 4  extremities. No evidence of extinction is noted.  Coordination: Cerebellar testing reveals good finger-nose-finger and heel-to-shin bilaterally.  Gait and station: Gait is normal.   DIAGNOSTIC DATA (LABS, IMAGING, TESTING) - I reviewed patient records, labs, notes, testing and imaging myself where available.  No flowsheet data found.   Lab Results  Component Value Date   WBC 5.8 06/15/2018   HGB 13.4 06/15/2018   HCT 38.7 06/15/2018   MCV 91 06/15/2018   PLT 295 06/15/2018      Component Value Date/Time   NA 145 (H) 06/15/2018 1011   K 4.4 06/15/2018 1011   CL 98 06/15/2018 1011   CO2 23 06/15/2018 1011   GLUCOSE 92 06/15/2018 1011   GLUCOSE 105 (H) 08/28/2015 1911  BUN 11 06/15/2018 1011   CREATININE 0.70 06/15/2018 1011   CALCIUM 9.5 06/15/2018 1011   PROT 7.6 06/15/2018 1011   ALBUMIN 4.8 06/15/2018 1011   AST 20 06/15/2018 1011   ALT 15 06/15/2018 1011   ALKPHOS 63 06/15/2018 1011   BILITOT 0.4 06/15/2018 1011   GFRNONAA 104 06/15/2018 1011   GFRAA 119 06/15/2018 1011   Lab Results  Component Value Date   CHOL 180 06/15/2018   HDL 48 06/15/2018   LDLCALC 113 (H) 06/15/2018   TRIG 97 06/15/2018   No results found for: HGBA1C Lab Results  Component Value Date   VITAMINB12 >2000 (H) 09/02/2015   No results found for: TSH     ASSESSMENT AND PLAN 49 y.o. year old female  has a past medical history of Environmental and seasonal allergies (01/03/2017), Headache (2017), Migraine, Neck pain (2017), and Tension headache, chronic (04/27/2015). here with     ICD-10-CM   1. Chronic tension-type headache, not intractable  G44.229   2. Neck pain  M54.2   3. Allergic rhinitis, unspecified seasonality, unspecified trigger  J30.9     She has not had any improvement of headaches since last being seen. She continues to have allergy symptoms and neck tension. We will try to switch her allergy medication from Claritin to Xyzal. She was also advised to try Mucinex every 12  hours for 2 weeks. We will discontinue imiprimine and try tizanidine 2mg  as needed for neck tension. She was advised to stay well hydrated. Continue healthy diet and regular exercise. She is currently uninsured. She will follow up in 3 months if no improvement and 1 year if she is doing well.    No orders of the defined types were placed in this encounter.    Meds ordered this encounter  Medications  . tizanidine (ZANAFLEX) 2 MG capsule    Sig: Take 1 capsule (2 mg total) by mouth 3 (three) times daily.    Dispense:  30 capsule    Refill:  11    Order Specific Question:   Supervising Provider    Answer:   Anson Fret      I spent 15 minutes with the patient. 50% of this time was spent counseling and educating patient on plan of care and medications.    [id number], FNP-C 06/13/2019, 8:00 AM [facility] [address] [place], [place] [phone]

## 2019-06-13 ENCOUNTER — Ambulatory Visit (INDEPENDENT_AMBULATORY_CARE_PROVIDER_SITE_OTHER): Payer: Self-pay | Admitting: Family Medicine

## 2019-06-13 ENCOUNTER — Encounter: Payer: Self-pay | Admitting: Family Medicine

## 2019-06-13 ENCOUNTER — Other Ambulatory Visit: Payer: Self-pay

## 2019-06-13 VITALS — BP 112/72 | HR 73 | Temp 96.9°F | Ht 59.5 in | Wt 128.0 lb

## 2019-06-13 DIAGNOSIS — M542 Cervicalgia: Secondary | ICD-10-CM

## 2019-06-13 DIAGNOSIS — J309 Allergic rhinitis, unspecified: Secondary | ICD-10-CM

## 2019-06-13 DIAGNOSIS — G44229 Chronic tension-type headache, not intractable: Secondary | ICD-10-CM

## 2019-06-13 MED ORDER — TIZANIDINE HCL 2 MG PO CAPS: 2.0000 mg | ORAL_CAPSULE | Freq: Three times a day (TID) | ORAL | 11 refills | Status: DC

## 2019-06-13 NOTE — Progress Notes (Signed)
I have read the note, and I agree with the clinical assessment and plan.  Shantoya Geurts A. Witten Certain, MD, PhD, FAAN Certified in Neurology, Clinical Neurophysiology, Sleep Medicine, Pain Medicine and Neuroimaging  [facility] [address], [address] [place], [place] [address] [phone]  

## 2019-06-13 NOTE — Patient Instructions (Signed)
I would like for you to try Xyzal (levoceterizine) daily for allergy symptoms. Mucinex (guaifenesin)  every 12 hours will also help. Ask pharmacist for help if you need.   We will try a low dose muscle relaxer for your neck tension. Take tizanidine 2mg  as needed for neck tension. Please have primary care check you out if this continues.   Follow up in 3 months if no improvement, 1 year if symptoms improve.   Rinitis alrgica en adultos Allergic Rhinitis, Adult La rinitis alrgica es una reaccin a los alrgenos que se encuentran en el aire. Los alrgenos son las partculas minsculas que estn en el aire y que hacen que el cuerpo tenga una reaccin . Esta afeccin no se puede transmitir de Counselling psychologist persona a otra (no es contagiosa). La rinitis alrgica no se puede curar, pero puede controlarse. [place] tipos de rinitis alrgica:  [employer]. Este tipo tambin se denomina fiebre del heno. Sucede nicamente durante ciertas pocas del ao.  [place]ren[place]. Este tipo puede ocurrir en cualquier momento del ao. Cules son las causas? Esta afeccin puede ser causada por lo siguiente:  El polen que proviene de los rboles, el pasto y las [place].  caros del polvo en [place].  Caspa de las Advice worker.  Moho. Cules son los signos o los sntomas? Los sntomas de esta afeccin incluyen:  Estornudos.  Nariz tapada o que gotea (congestin nasal).  Abundante mucosidad en la parte posterior de la garganta (goteo posnasal).  Escozor en la [employer]  Lagrimeo.  Dificultad para dormir.  Estar somnoliento Clinical cytogeneticist. Cmo se trata? No hay cura para esta afeccin. Debe evitar las cosas que desencadenan sus sntomas (alrgenos). El tratamiento puede ayudar a Administrator sntomas. Puede incluir:  Medicamentos que [employer] sntomas de la [place], [place] antihistamnicos. Estos pueden administrarse en forma de inyeccin, aerosol nasal o comprimidos.  Vacunas que se administran  hasta que el cuerpo se vuelve menos sensible al alrgeno (desensibilizacin).  Medicamentos ms potentes, si todos los dems tratamientos no han sido eficaces. Siga estas indicaciones en su casa: Evite los alrgenos   Conozca a qu es [place]. Los alrgenos comunes incluyen el humo, polvo y [place].  Evtelos si puede. Hay algunas medidas que puede tomar para evitar los alrgenos: ? [place] alfombras por pisos de [place], baldosas o vinilo. Las alfombras pueden retener la caspa de los animales y [place]. ? Limpie cualquier moho que encuentre en la casa. ? No fume. No permita que fumen en su casa. ? Cambie el filtro de la calefaccin y del aire acondicionado al menos una vez al mes. ? Durante la temporada de alergias:  Mantenga las ventanas cerradas todo el tiempo posible. Si es posible, use aire acondicionado cuando hay mucho polen en el aire.  Use un filtro especial para alergias con la caldera y el aire acondicionado.  Planee actividades al aire libre cuando las concentraciones de polen estn en su nivel ms bajo. Normalmente, esto es por la maana temprano o durante las horas de la noche.  Si sale al aire libre cuando la concentracin de polen es [place], use una mscara especial para personas con [place].  Cuando vuelva al interior, dese una ducha y Environmental consultant de ropa antes de sentarse en los muebles o en la cama. Indicaciones generales  No use ventiladores en su hogar.  No cuelgue ropa en el exterior para que se seque.  Use gafas para el sol para mantener el polen alejado de los ojos.  Lvese las manos enseguida despus de tocar a las Avnetmascotas domsticas.  Tome los medicamentos de venta libre y los recetados solamente como se lo haya indicado el mdico.  OceanographerConcurra a todas las visitas de control como se lo haya indicado el mdico. Esto es importante. Comunquese con un mdico si:  Tiene fiebre.  Tiene tos que no desaparece (es persistente).  Comienza a emitir un  sonido agudo al respirar (sibilancias).  Los sntomas no mejoran con Scientist, research (medical)el tratamiento.  Le sale lquido espeso por la Clinical cytogeneticistnariz.  Comienza a tener hemorragia nasal. Solicite ayuda inmediatamente si:  Tiene la boca o los labios hinchados.  Tiene dificultad para respirar.  Se siente mareado o como si se fuera a desmayar.  Tiene transpiracin fra. Resumen  La rinitis alrgica es una reaccin a los alrgenos que se encuentran en el aire.  Esta afeccin puede ser causada por alrgenos. Estos Phelps Dodgeincluyen el polen, los caros del polvo, la caspa de las mascotas y [place] moho.  Los sntomas son goteo y picazn nasal, estornudos o Arboriculturistlagrimeo. Tambin es posible que tenga dificultad para dormir o que sienta sueo Administratordurante el da.  El tratamiento incluye tomar medicamentos y Secretary/administratorevitar los alrgenos. Tambin es posible que deba recibir vacunas o tomar medicamentos ms potentes.  Solicite ayuda si tiene fiebre o tos que no se detiene. Solicite ayuda de inmediato si le falta el aire. Esta informacin no tiene Theme park managercomo fin reemplazar el consejo del mdico. Asegrese de hacerle al mdico cualquier pregunta que tenga. Document Revised: 12/14/2017 Document Reviewed: 12/14/2017 Elsevier Patient Education  2020 Elsevier Inc.   Cefalea tensional en los adultos Tension Headache, Adult La cefalea tensional es un dolor o una presin que se siente en la cabeza. Las cefaleas tensionales pueden durar de 30 minutos a [address] das. Siga estas indicaciones en su casa: Control del W. R. Berkleydolor  Tome los medicamentos de venta libre y los recetados solamente como se lo haya indicado el mdico.  Cuando tenga una cefalea, acustese en una habitacin oscura y silenciosa.  Si se lo indican, aplquese hielo en la cabeza y el cuello: ? Ponga el hielo en una bolsa plstica. ? Coloque una FirstEnergy Corptoalla entre la piel y la bolsa de hielo. ? Coloque el hielo durante 20minutos, 2 o 3veces por da.  Si se lo indican, aplquese calor en la nuca.  Hgalo con la frecuencia que le haya dicho el mdico. Use el tipo de calor que el mdico le recomiende, como una compresa de calor hmedo o una almohadilla trmica. ? Coloque una FirstEnergy Corptoalla entre la piel y la fuente de Airline pilotcalor. ? Aplique el calor durante 20 a 30minutos. ? Retire la fuente de calor si la piel se pone de color rojo brillante. Comida y bebida  Mantenga un horario para las comidas.  Controle la cantidad de alcohol que bebe: ? Si es mujer y no est embarazada, no consuma ms de 1 bebida por Futures traderda. ? Si es hombre, no consuma ms de 2 bebidas por Futures traderda.  Beba suficiente lquido para Radio producermantener el pis (orina) de color amarillo plido.  No consuma mucha cafena ni deje de consumirla por completo. Estilo de vida  Duerma lo suficiente. Duerma entre 7y 9horas todas las noches. O duerma la cantidad de Sempra Energytiempo que le indique el mdico.  A la hora de dormir, retire todos los dispositivos electrnicos de su habitacin. Algunos ejemplos de dispositivos electrnicos son las computadoras, los telfonos y las tabletas.  Encuentre maneras de Museum/gallery exhibitions officerreducir el estrs. Las siguientes son algunas cosas  que pueden reducir el estrs: ? Actividad fsica. ? Respiracin profunda. ? Yoga. ? Msica. ? Pensamientos positivos.  Sintese con la espalda recta. No contraiga (tensione) los msculos.  No consuma ningn producto que contenga nicotina o tabaco, como cigarrillos y Administrator, Civil Service. Si necesita ayuda para dejar de fumar, consulte al mdico. Instrucciones generales   Concurra a todas las visitas de control como se lo haya indicado el mdico. Esto es importante.  Evite las cosas que puedan provocar las cefaleas. Lleve un registro diario para Financial risk analyst si ciertas cosas provocan los dolores de [place]. Registre, por ejemplo, lo siguiente: ? Lo que usted come y bebe. ? Cunto tiempo duerme. ? Algn cambio en su dieta o en los medicamentos. Comunquese con un mdico si:  La cefalea no se  alivia.  La cefalea regresa.  Tiene una cefalea y [employer] sonidos, la luz o los olores.  Tiene malestar estomacal (nuseas) o vomita.  Le duele el estmago. Solicite ayuda de inmediato si:  Siente un dolor de cabeza muy intenso de repente junto con alguno de estos sntomas: ? Rigidez en el cuello. ? Ganas de vomitar. ? Vmitos. ? Sensacin de debilidad. ? Dificultad para ver. ? Falta de aire. ? Erupcin cutnea. ? Somnolencia inusual. ? Dificultad para hablar. ? Dolor en el ojo o el odo. ? Dificultad para caminar o mantener el equilibrio. ? Sensacin de que se desvanecer (Hospital doctor). ? Desmayo. Resumen  La cefalea tensional es un dolor o una presin que se siente en la cabeza.  Las cefaleas tensionales pueden durar de 30 minutos a [address].  Los [employer] en el estilo de vida y los medicamentos pueden ayudar a Engineer, materials. Esta informacin no tiene Theme park manager el consejo del mdico. Asegrese de hacerle al mdico cualquier pregunta que tenga. Document Revised: 11/15/2016 Document Reviewed: 11/15/2016 Elsevier Patient Education  2020 [employer].   Ejercicios para el cuello Neck Exercises Pregunte al mdico qu ejercicios son seguros para usted. Haga los ejercicios exactamente como se lo haya indicado el mdico y gradelos como se lo hayan indicado. Es normal sentir un estiramiento leve, tironeo, opresin o Dentist al [employer] ejercicios. Detngase de inmediato si siente un dolor repentino o Community education officer. No comience a hacer estos ejercicios hasta que se lo indique el mdico. Los ejercicios para el cuello pueden ser importantes por muchos motivos. Pueden mejorar la fuerza y Pharmacologist la flexibilidad del cuello, lo que ser til para la parte superior de la espalda y para Estate agent de cuello. Ejercicios de estiramiento Estiramiento del cuello con rotacin  1. Sintese en una silla o prese. 2. Apoye los pies en el piso, con una  separacin del ancho de los hombros. 3. Gire lentamente la cabeza (rtela) hacia la derecha hasta sentir un ligero estiramiento. Grela completamente hacia la derecha de modo que pueda ver por encima de su hombro derecho. No incline ni ladee la cabeza. 4. Mantenga esta posicin durante 10 a 30segundos. 5. Gire lentamente la cabeza (rtela) hacia la izquierda hasta sentir un ligero estiramiento. Grela completamente hacia la izquierda de modo que pueda ver por encima de su hombro izquierdo. No incline ni ladee la cabeza. 6. Mantenga esta posicin durante 10 a 30segundos. Repita __________ veces. Realice este ejercicio __________ veces al da. Retraccin del cuello 1. Sintese en una silla resistente o prese. 2. Mire hacia adelante. No doble el cuello. 3. Use los dedos para empujar la barbilla hacia atrs (retraccin). No doble  el cuello al [employer]. Siga mirando hacia adelante. Si hace el ejercicio correctamente, tendr Ardelia Mems leve sensacin en la garganta y sentir que la nuca se estira. 4. Mantenga la elongacin durante 1o 2segundos. Repita __________ veces. Realice este ejercicio __________ veces al da. Ejercicios de fortalecimiento Presin con el cuello 1. Recustese sobre su espalda en una cama firme o en el suelo, y coloque una almohada debajo de la cabeza. 2. Use los msculos del cuello para presionar la cabeza contra la almohada y enderezar la columna vertebral. 3. Mantenga la posicin lo mejor que pueda. Mantenga la Alejandro Mulling arriba (en posicin neutral) y el mentn hacia abajo. 4. Cuente lentamente hasta 66mientras mantiene esta posicin. Repita __________ veces. Realice este ejercicio __________ veces al da. Isometra Estos son ejercicios en los que se fortalecen los msculos del cuello mientras se mantiene el cuello quieto (isometra). 1. Sintese en una silla con buen apoyo y coloque una mano sobre la frente. 2. Mantenga la cabeza y la cara mirando hacia  adelante. No flexione o extienda el cuello mientras hace ejercicios isomtricos. 3. Empuje hacia adelante con la cabeza y el cuello mientras que con la mano ejerce cierta presin hacia el lado contrario. Mantenga esta posicin durante 10segundos. 4. Vuelva a realizar la secuencia, esta vez poniendo la mano contra la nuca. Use la cabeza y el cuello para empujar en la direccin contraria a la presin que ejerce con la mano. 5. Para terminar, haga el mismo ejercicio en cada lado de la cabeza, empujando hacia los lados en la direccin contraria a la presin de Insurance risk surveyor. Repita __________ veces. Realice este ejercicio __________ veces al da. Levantamientos de la cabeza desde la posicin decbito prono 1. Acustese boca abajo (posicin prona) y [employer] codos de modo que el pecho y la parte superior de la espalda se eleven. 2. Comience con la cabeza mirando hacia abajo, cerca del pecho. Coloque el mentn cerca del pecho o [place]. 3. Eleve lentamente la cabeza. Hgalo hasta quedar mirando hacia adelante. Luego contine elevando y estirando la cabeza hacia atrs lo ms que pueda con comodidad. 4. Mantenga la cabeza elevada durante 5segundos. A continuacin, bjela lentamente hasta la posicin inicial. Repita __________ veces. Realice este ejercicio __________ veces al da. Levantamientos de la cabeza desde la posicin decbito supino 1. Acustese sobre la espalda (posicin decbito supino), doble las rodillas apuntando hacia el techo y [employer] pies apoyados en el piso. 2. Levante lentamente la cabeza del suelo, y eleve el mentn hacia el [place]. 3. Mantenga esta posicin durante 5segundos. Repita __________ veces. Realice este ejercicio __________ veces al da. Retraccin escapular 1. Prese con los brazos a los costados. Mire hacia adelante. 2. Lentamente, lleve los hombros (escpulas) hacia atrs y hacia abajo (retraccin) hasta sentir que la zona de los omplatos, en la parte alta de la  espalda, se estira. 3. Mantenga esta posicin durante 10 a 30segundos. 4. Reljese y luego repita el ejercicio. Repita __________ veces. Realice este ejercicio __________ veces al da. Comunquese con un mdico si:  El dolor o las molestias en el cuello se vuelven mucho ms intensos cuando hace un ejercicio.  El dolor o las molestias en el cuello no mejoran en el trmino de las 2horas posteriores a Therapist, art. Si tiene alguno de Mirant, deje de [employer] ejercicios de inmediato. No vuelva a hacer los ejercicios a menos que el mdico lo autorice. Solicite ayuda inmediatamente si:  Siente un dolor sbito e  intenso en el cuello. Si esto ocurre, deje de [employer] ejercicios de inmediato. No vuelva a hacer los ejercicios a menos que el mdico lo autorice. Esta informacin no tiene Theme park manager el consejo del mdico. Asegrese de hacerle al mdico cualquier pregunta que tenga. Document Revised: 03/14/2018 Document Reviewed: 03/14/2018 Elsevier Patient Education  2020 [employer].   Tizanidine tablets or capsules Qu es este medicamento? La TIZANIDINA ayuda a [employer]. Se utiliza tambin en el tratamiento de esclerosis mltiple o lesiones en la columna vertebral. Este medicamento puede ser utilizado para otros usos; si tiene alguna pregunta consulte con su proveedor de atencin mdica o con su farmacutico. MARCAS COMUNES: Zanaflex Qu le debo informar a mi profesional de la salud antes de tomar este medicamento? Necesita saber si usted presenta alguno de los [employer] o situaciones:  enfermedad renal  enfermedad heptica  baja presin sangunea  trastorno mental  una reaccin alrgica o inusual a la tizanidina, a otros medicamentos, a la lactosa (tabletas solamente), alimentos, colorantes o conservantes  si est embarazada o buscando quedar embarazada  si est amamantando a un beb Cmo debo utilizar este  medicamento? Tome este medicamento por va oral con un vaso lleno de agua. Tome este medicamento con el estmago vaco, por lo menos 30 minutos antes o 2 horas despus de la comida. No lo tome con alimentos sin consultar a su mdico. Siga las instrucciones de la etiqueta del [place]. Tome sus dosis a intervalos regulares. No tome su medicamento con una frecuencia mayor a la indicada. No deje de tomarlo excepto si as lo indica su mdico. Es peligroso suspender su medicamento de [place]. Hable con su pediatra para informarse acerca del uso de este medicamento en nios. Puede requerir Customer service manager. Los pacientes de ms de 65 aos de edad pueden presentar reacciones ms fuertes y Pension scheme manager dosis [employer]. Sobredosis: Pngase en contacto inmediatamente con un centro toxicolgico o una sala de urgencia si usted cree que haya tomado demasiado medicamento. ATENCIN: [employer] es solo para usted. No comparta este medicamento con nadie. Qu sucede si me olvido de una dosis? Si olvida una dosis, tmela lo antes posible. Si es casi la hora de la prxima dosis, tome slo esa dosis. No tome dosis adicionales o dobles. Qu puede interactuar con este medicamento? No tome este medicamento con ninguno de los siguientes frmacos: ciprofloxacino fluvoxamina medicamentos narcticos para la tos tiabendazol Esta medicina tambin puede interactuar con los siguientes medicamentos: aciclovir alcohol antihistamnicos para Programmer, multimedia, tos y resfro baclofeno ciertos medicamentos para la ansiedad o para dormir ciertos medicamentos para la presin sangunea, enfermedad cardiaca y ritmo cardiaco irregular ciertos medicamentos para la depresin, como amitriptilina, fluoxetina, sertralina ciertos medicamentos para convulsiones, tales como fenobarbital, primidona ciertos medicamentos para problemas estomacales, tales como cimetidina, famotidina hormonas femeninas, como estrgenos o progestinas, y pldoras, parches,  anillos o inyecciones anticonceptivos anestsicos generales, tales como halotano, isoflurano, metoxiflurano, propofol anestsicos locales, tales como lidocana, pramoxina, tetracana medicamentos para relajar los msculos antes de una ciruga medicamentos narcticos para el dolor fenotiazinas, tales como clorpromazina, mesoridazina, proclorperazina ticlopidina zileutn Puede ser que esta lista no menciona todas las posibles interacciones. Informe a su profesional de [employer] de [employer] productos a base de hierbas, medicamentos de [place] o suplementos nutritivos que est tomando. Si usted fuma, consume bebidas alcohlicas o si utiliza drogas ilegales, indqueselo tambin a su profesional de [employer]. Algunas sustancias pueden interactuar con su medicamento. A qu debo estar atento al  usar P[employer] Corporation? Informe a su mdico o a su profesional de la salud si sus sntomas no comienzan a mejorar o si empeoran. Puede experimentar somnolencia o mareos. No conduzca, no utilice maquinaria ni haga nada que Scientist, research (life sciences) en estado de alerta hasta que sepa cmo le afecta este medicamento. No se siente ni se ponga de pie con rapidez, especialmente si es un paciente de edad avanzada. Esto reduce el riesgo de mareos o [employer]. El alcohol puede interferir con el efecto de [place]. Evite consumir bebidas alcohlicas. Si est tomando otro medicamento que tambin causa somnolencia, es posible que tenga ms efectos secundarios. Entrguele a su proveedor de atencin mdica una lista de todos los medicamentos que [place]. Su mdico le dir cunto Risk manager. No tome ms medicamento que lo indicado. Llame al servicio de emergencias para recibir ayuda si tiene problemas para respirar o somnolencia inusual. Se le podra secar la boca. Masticar chicle sin azcar, chupar caramelos duros y tomar agua en abundancia lo ayudar a mantener la boca hmeda. Si el problema no desaparece o es severo, consulte a su  mdico. Qu efectos secundarios puedo tener al [employer] este medicamento? Efectos secundarios que debe informar a su mdico o a Producer, television/film/video de la salud tan pronto como sea posible: Therapist, art, como erupcin cutnea, comezn/picazn o urticarias, hinchazn de la cara, los labios o la lengua problemas respiratorios alucinaciones signos y sntomas de lesin al hgado, como orina amarilla oscura o [place]; sensacin general de estar enfermo o sntomas gripales; heces claras; prdida de apetito; nuseas; dolor en la regin abdominal superior derecha; cansancio o debilidad inusual; color amarillento de los ojos o la piel signos y sntomas de presin sangunea baja, tales como mareos, sensacin de [place] o aturdimiento, cadas, cansancio o debilidad inusual pulso cardiaco lento, inusual cansancio o debilidad inusual Efectos secundarios que generalmente no requieren atencin mdica (debe informarlos a su mdico o a Producer, television/film/video de la salud si persisten o si son molestos): visin borrosa estreimiento mareos boca seca cansancio Puede ser que esta lista no menciona todos los posibles efectos secundarios. Comunquese a su mdico por asesoramiento mdico [employer]lett[employer]. Usted puede informar los efectos secundarios a la FDA por telfono al 1-800-FDA-1088. Dnde debo guardar mi medicina? Mantngala fuera del alcance de los nios. Gurdela a [employer], entre 15 y 30 grados C (55 y 45 grados F). Deseche los medicamentos que no haya utilizado, despus de la fecha de vencimiento. ATENCIN: Este folleto es un resumen. Puede ser que no cubra toda la posible informacin. Si usted tiene preguntas acerca de esta medicina, consulte con su mdico, su farmacutico o su profesional de Radiographer, therapeutic.  2020 Elsevier/Gold Standard (2017-03-21 00:00:00)

## 2019-06-21 ENCOUNTER — Other Ambulatory Visit (HOSPITAL_COMMUNITY)
Admission: RE | Admit: 2019-06-21 | Discharge: 2019-06-21 | Disposition: A | Discharge status: Home / Self Care | Payer: No Typology Code available for payment source | Source: Ambulatory Visit | Attending: Primary Care | Admitting: Primary Care

## 2019-06-21 ENCOUNTER — Encounter (INDEPENDENT_AMBULATORY_CARE_PROVIDER_SITE_OTHER): Payer: Self-pay | Admitting: Primary Care

## 2019-06-21 ENCOUNTER — Other Ambulatory Visit: Payer: Self-pay

## 2019-06-21 ENCOUNTER — Ambulatory Visit (INDEPENDENT_AMBULATORY_CARE_PROVIDER_SITE_OTHER): Payer: Self-pay | Admitting: Primary Care

## 2019-06-21 VITALS — BP 113/75 | HR 89 | Temp 97.5°F | Ht 59.5 in | Wt 127.8 lb

## 2019-06-21 DIAGNOSIS — Z124 Encounter for screening for malignant neoplasm of cervix: Secondary | ICD-10-CM | POA: Insufficient documentation

## 2019-06-21 DIAGNOSIS — E782 Mixed hyperlipidemia: Secondary | ICD-10-CM

## 2019-06-21 DIAGNOSIS — Z0000 Encounter for general adult medical examination without abnormal findings: Secondary | ICD-10-CM

## 2019-06-21 DIAGNOSIS — Z1231 Encounter for screening mammogram for malignant neoplasm of breast: Secondary | ICD-10-CM

## 2019-06-21 DIAGNOSIS — Z113 Encounter for screening for infections with a predominantly sexual mode of transmission: Secondary | ICD-10-CM

## 2019-06-21 NOTE — Progress Notes (Signed)
Established Patient Office Visit  Subjective:  Patient ID: Natalie Waller, female    DOB: [birth year]  Age: 49 y.o. MRN: [id number]  CC:  Chief Complaint  Patient presents with  . Annual Exam  . Gynecologic Exam    HPI Natalie Waller presents for annual and gynecological exam.   Past Medical History:  Diagnosis Date  . Environmental and seasonal allergies 01/03/2017  . Headache 2017  . Migraine   . Neck pain 2017  . Tension headache, chronic 04/27/2015    Past Surgical History:  Procedure Laterality Date  . APPENDECTOMY  2009  . CESAREAN SECTION  1997    Family History  Problem Relation Age of Onset  . Hepatitis C Mother     Social History   Socioeconomic History  . Marital status: Married    Spouse name: Natalie Waller  . Number of children: 1  . Years of education: 68  . Highest education level: Not on file  Occupational History  . Occupation: unemployed     Comment: Previously worked as Education administrator in a gym  Tobacco Use  . Smoking status: Never Smoker  . Smokeless tobacco: Never Used  Substance and Sexual Activity  . Alcohol use: No    Alcohol/week: 0.0 standard drinks  . Drug use: No  . Sexual activity: Yes    Birth control/protection: Condom  Other Topics Concern  . Not on file  Social History Narrative   C[place] to [place] In [place]   Married her current husband, Natalie Waller May 2017   Lives at home with her husband   Caffeine use: very rare   Right handed   Social Determinants of Health   Financial Resource Strain:   . Difficulty of Paying Living Expenses: Not on file  Food Insecurity:   . Worried About Charity fundraiser in the Last Year: Not on file  . Ran Out of Food in the Last Year: Not on file  Transportation Needs:   . Lack of Transportation (Medical): Not on file  . Lack of Transportation (Non-Medical): Not on file  Physical Activity:   . Days of Exercise per Week: Not on file  . Minutes of Exercise per Session: Not on file  Stress:   .  Feeling of Stress : Not on file  Social Connections:   . Frequency of Communication with Friends and Family: Not on file  . Frequency of Social Gatherings with Friends and Family: Not on file  . Attends Religious Services: Not on file  . Active Member of Clubs or Organizations: Not on file  . Attends Archivist Meetings: Not on file  . Marital Status: Not on file  Intimate Partner Violence:   . Fear of Current or Ex-Partner: Not on file  . Emotionally Abused: Not on file  . Physically Abused: Not on file  . Sexually Abused: Not on file    Outpatient Medications Prior to Visit  Medication Sig Dispense Refill  . loratadine (CLARITIN) 10 MG tablet Take 1 tablet (10 mg total) by mouth daily. 30 tablet 11  . tizanidine (ZANAFLEX) 2 MG capsule Take 1 capsule (2 mg total) by mouth 3 (three) times daily. 30 capsule 11   No facility-administered medications prior to visit.    No Known Allergies  ROS Review of Systems  All other systems reviewed and are negative.     Objective:    Physical Exam  CONSTITUTIONAL: Well-developed, well-nourished female in no acute distress.  HENT:  Normocephalic, atraumatic,  External right and left ear normal. EYES: Conjunctivae and EOM are normal. Pupils are equal, round, and reactive to light. No scleral icterus.  NECK: Normal range of motion, supple, no masses.  Normal thyroid.  SKIN: Skin is warm and dry. No rash noted. Not diaphoretic. No erythema. No pallor. [place]: Alert and oriented to person, place, and time. Normal reflexes, muscle tone coordination. No cranial nerve deficit noted. PSYCHIATRIC: Normal mood and affect. Normal behavior. Normal judgment and thought content. CARDIOVASCULAR: Normal heart rate noted, regular rhythm RESPIRATORY: Clear to auscultation bilaterally. Effort and breath sounds normal, no problems with respiration noted. BREASTS: Taught SBE refer for mammogram  ABDOMEN: Soft, normal bowel sounds, no distention  noted.  No tenderness, rebound or guarding.  PELVIC: Normal appearing external genitalia; normal appearing vaginal mucosa and cervix.  No abnormal discharge noted.  Pap smear obtained.  Normal uterine size, no other palpable masses, no uterine or adnexal tenderness. MUSCULOSKELETAL: Normal range of motion. No tenderness.  No cyanosis, clubbing, or edema.  2+ distal pulses. BP 113/75 (BP Location: Right Arm, Patient Position: Sitting, Cuff Size: Normal)   Pulse 89   Temp (!) 97.5 F (36.4 C) (Temporal)   Ht 4' 11.5" (1.511 m)   Wt 127 lb 12.8 oz (58 kg)   LMP 06/10/2019 (Exact Date)   SpO2 98%   BMI 25.38 kg/m  Wt Readings from Last 3 Encounters:  06/21/19 127 lb 12.8 oz (58 kg)  06/13/19 128 lb (58.1 kg)  02/08/19 126 lb 12.8 oz (57.5 kg)     Health Maintenance Due  Topic Date Due  . INFLUENZA VACCINE  11/25/2018  . PAP SMEAR-Modifier  06/11/2019    There are no preventive care reminders to display for this patient.  No results found for: TSH Lab Results  Component Value Date   WBC 5.6 06/22/2019   HGB 13.6 06/22/2019   HCT 40.9 06/22/2019   MCV 93 06/22/2019   PLT 255 06/22/2019   Lab Results  Component Value Date   NA 140 06/22/2019   K 4.2 06/22/2019   CO2 24 06/22/2019   GLUCOSE 89 06/22/2019   BUN 9 06/22/2019   CREATININE 0.75 06/22/2019   BILITOT 0.4 06/22/2019   ALKPHOS 67 06/22/2019   AST 30 06/22/2019   ALT 36 (H) 06/22/2019   PROT 7.3 06/22/2019   ALBUMIN 4.5 06/22/2019   CALCIUM 9.4 06/22/2019   ANIONGAP 12 08/28/2015   Lab Results  Component Value Date   CHOL 228 (H) 06/22/2019   Lab Results  Component Value Date   HDL 55 06/22/2019   Lab Results  Component Value Date   LDLCALC 149 (H) 06/22/2019   Lab Results  Component Value Date   TRIG 137 06/22/2019   Lab Results  Component Value Date   CHOLHDL 4.1 06/22/2019   No results found for: HGBA1C    Assessment & Plan:  Natalie Waller was seen today for annual exam and gynecologic  exam.  Diagnoses and all orders for this visit:  Cervical cancer screening -     Cytology - PAP([place])  Encounter for screening examination for sexually transmitted disease -     Cervicovaginal ancillary only  Mixed hyperlipidemia Encourage to lower your fatty foods, red meat, cheese, milk and increase fiber like whole grains and veggies.-     Lipid Panel; Future  Annual physical exam -     CBC with Differential; Future -     CMP14+EGFR; Future  Encounter for screening mammogram for malignant  neoplasm of breast Patient completed application for B[facility]P while in clinic and application has and faxed to [facility]. Patient aware that [facility] will contact her directly to schedule appointment. -     MM Digital Diagnostic Bilat; Future     Follow-up: Return if symptoms worsen or fail to improve.    Kerin Perna, NP

## 2019-06-21 NOTE — Patient Instructions (Signed)
Mantenimiento de la salud en las mujeres Health Maintenance, Female Adoptar un estilo de vida saludable y recibir atencin preventiva son importantes para promover la salud y el bienestar. Consulte al mdico sobre:  El esquema adecuado para hacerse pruebas y exmenes peridicos.  Cosas que puede hacer por su cuenta para prevenir enfermedades y mantenerse sana. Qu debo saber sobre la dieta, el peso y el ejercicio? Consuma una dieta saludable   Consuma una dieta que incluya muchas verduras, frutas, productos lcteos con bajo contenido de grasa y protenas magras.  No consuma muchos alimentos ricos en grasas slidas, azcares agregados o sodio. Mantenga un peso saludable El ndice de masa muscular (IMC) se utiliza para identificar problemas de peso. Proporciona una estimacin de la grasa corporal basndose en el peso y la altura. Su mdico puede ayudarle a determinar su IMC y a lograr o mantener un peso saludable. Haga ejercicio con regularidad Haga ejercicio con regularidad. Esta es una de las prcticas ms importantes que puede hacer por su salud. La mayora de los adultos deben seguir estas pautas:  Realizar, al menos, 150minutos de actividad fsica por semana. El ejercicio debe aumentar la frecuencia cardaca y hacerlo transpirar (ejercicio de intensidad moderada).  Hacer ejercicios de fortalecimiento por lo menos dos veces por semana. Agregue esto a su plan de ejercicio de intensidad moderada.  Pasar menos tiempo sentados. Incluso la actividad fsica ligera puede ser beneficiosa. Controle sus niveles de colesterol y lpidos en la sangre Comience a realizarse anlisis de lpidos y colesterol en la sangre a los 20aos y luego reptalos cada 5aos. Hgase controlar los niveles de colesterol con mayor frecuencia si:  Sus niveles de lpidos y colesterol son altos.  Es mayor de 40aos.  Presenta un alto riesgo de padecer enfermedades cardacas. Qu debo saber sobre las pruebas de  deteccin del cncer? Segn su historia clnica y sus antecedentes familiares, es posible que deba realizarse pruebas de deteccin del cncer en diferentes edades. Esto puede incluir pruebas de deteccin de lo siguiente:  Cncer de mama.  Cncer de cuello uterino.  Cncer colorrectal.  Cncer de piel.  Cncer de pulmn. Qu debo saber sobre la enfermedad cardaca, la diabetes y la hipertensin arterial? Presin arterial y enfermedad cardaca  La hipertensin arterial causa enfermedades cardacas y aumenta el riesgo de accidente cerebrovascular. Es ms probable que esto se manifieste en las personas que tienen lecturas de presin arterial alta, tienen ascendencia africana o tienen sobrepeso.  Hgase controlar la presin arterial: ? Cada 3 a 5 aos si tiene entre 18 y 39 aos. ? Todos los aos si es mayor de 40aos. Diabetes Realcese exmenes de deteccin de la diabetes con regularidad. Este anlisis revisa el nivel de azcar en la sangre en ayunas. Hgase las pruebas de deteccin:  Cada tresaos despus de los 40aos de edad si tiene un peso normal y un bajo riesgo de padecer diabetes.  Con ms frecuencia y a partir de una edad inferior si tiene sobrepeso o un alto riesgo de padecer diabetes. Qu debo saber sobre la prevencin de infecciones? Hepatitis B Si tiene un riesgo ms alto de contraer hepatitis B, debe someterse a un examen de deteccin de este virus. Hable con el mdico para averiguar si tiene riesgo de contraer la infeccin por hepatitis B. Hepatitis C Se recomienda el anlisis a:  Todos los que nacieron entre 1945 y 1965.  Todas las personas que tengan un riesgo de haber contrado hepatitis C. Enfermedades de transmisin sexual (ETS)  Hgase las   pruebas de deteccin de ITS, incluidas la gonorrea y la clamidia, si: ? Es sexualmente activa y es menor de 24aos. ? Es mayor de 24aos, y el mdico le informa que corre riesgo de tener este tipo de  infecciones. ? La actividad sexual ha cambiado desde que le hicieron la ltima prueba de deteccin y tiene un riesgo mayor de tener clamidia o gonorrea. Pregntele al mdico si usted tiene riesgo.  Pregntele al mdico si usted tiene un alto riesgo de contraer VIH. El mdico tambin puede recomendarle un medicamento recetado para ayudar a evitar la infeccin por el VIH. Si elige tomar medicamentos para prevenir el VIH, primero debe hacerse los anlisis de deteccin del VIH. Luego debe hacerse anlisis cada 3meses mientras est tomando los medicamentos. Embarazo  Si est por dejar de menstruar (fase premenopusica) y usted puede quedar embarazada, busque asesoramiento antes de quedar embarazada.  Tome de 400 a 800microgramos (mcg) de cido flico todos los das si queda embarazada.  Pida mtodos de control de la natalidad (anticonceptivos) si desea evitar un embarazo no deseado. Osteoporosis y menopausia La osteoporosis es una enfermedad en la que los huesos pierden los minerales y la fuerza por el avance de la edad. El resultado pueden ser fracturas en los huesos. Si tiene 65aos o ms, o si est en riesgo de sufrir osteoporosis y fracturas, pregunte a su mdico si debe:  Hacerse pruebas de deteccin de prdida sea.  Tomar un suplemento de calcio o de vitamina D para reducir el riesgo de fracturas.  Recibir terapia de reemplazo hormonal (TRH) para tratar los sntomas de la menopausia. Siga estas instrucciones en su casa: Estilo de vida  No consuma ningn producto que contenga nicotina o tabaco, como cigarrillos, cigarrillos electrnicos y tabaco de mascar. Si necesita ayuda para dejar de fumar, consulte al mdico.  No consuma drogas.  No comparta agujas.  Solicite ayuda a su mdico si necesita apoyo o informacin para abandonar las drogas. Consumo de alcohol  No beba alcohol si: ? Su mdico le indica no hacerlo. ? Est embarazada, puede estar embarazada o est tratando de quedar  embarazada.  Si bebe alcohol: ? Limite la cantidad que consume de 0 a 1 medida por da. ? Limite la ingesta si est amamantando.  Est atento a la cantidad de alcohol que hay en las bebidas que toma. En [place], una medida equivale a una botella de cerveza de 12oz (355ml), un vaso de vino de 5oz (148ml) o un vaso de una bebida alcohlica de alta graduacin de 1oz (44ml). Instrucciones generales  Realcese los estudios de rutina de la salud, dentales y de la vista.  Mantngase al da con las vacunas.  Infrmele a su mdico si: ? Se siente deprimida con frecuencia. ? Alguna vez ha sido vctima de maltrato o no se siente segura en su casa. Resumen  Adoptar un estilo de vida saludable y recibir atencin preventiva son importantes para promover la salud y el bienestar.  Siga las instrucciones del mdico acerca de una dieta saludable, el ejercicio y la realizacin de pruebas o exmenes para detectar enfermedades.  Siga las instrucciones del mdico con respecto al control del colesterol y la presin arterial. Esta informacin no tiene como fin reemplazar el consejo del mdico. Asegrese de hacerle al mdico cualquier pregunta que tenga. Document Revised: 05/03/2018 Document Reviewed: 05/03/2018 Elsevier Patient Education  2020 Elsevier Inc.  

## 2019-06-22 ENCOUNTER — Other Ambulatory Visit (INDEPENDENT_AMBULATORY_CARE_PROVIDER_SITE_OTHER): Payer: Self-pay

## 2019-06-22 DIAGNOSIS — E782 Mixed hyperlipidemia: Secondary | ICD-10-CM

## 2019-06-22 DIAGNOSIS — Z0000 Encounter for general adult medical examination without abnormal findings: Secondary | ICD-10-CM

## 2019-06-22 LAB — CERVICOVAGINAL ANCILLARY ONLY
Bacterial Vaginitis (gardnerella): NEGATIVE
Candida Glabrata: NEGATIVE
Candida Vaginitis: NEGATIVE
Chlamydia: NEGATIVE
Comment: NEGATIVE
Comment: NEGATIVE
Comment: NEGATIVE
Comment: NEGATIVE
Comment: NEGATIVE
Comment: NORMAL
Neisseria Gonorrhea: NEGATIVE
Trichomonas: NEGATIVE

## 2019-06-23 LAB — CMP14+EGFR
ALT: 36 IU/L — ABNORMAL HIGH (ref 0–32)
AST: 30 IU/L (ref 0–40)
Albumin/Globulin Ratio: 1.6 (ref 1.2–2.2)
Albumin: 4.5 g/dL (ref 3.8–4.8)
Alkaline Phosphatase: 67 IU/L (ref 39–117)
BUN/Creatinine Ratio: 12 (ref 9–23)
BUN: 9 mg/dL (ref 6–24)
Bilirubin Total: 0.4 mg/dL (ref 0.0–1.2)
CO2: 24 mmol/L (ref 20–29)
Calcium: 9.4 mg/dL (ref 8.7–10.2)
Chloride: 102 mmol/L (ref 96–106)
Creatinine, Ser: 0.75 mg/dL (ref 0.57–1.00)
GFR calc Af Amer: 109 mL/min/{1.73_m2} (ref >59)
GFR calc non Af Amer: 95 mL/min/{1.73_m2} (ref >59)
Globulin, Total: 2.8 g/dL (ref 1.5–4.5)
Glucose: 89 mg/dL (ref 65–99)
Potassium: 4.2 mmol/L (ref 3.5–5.2)
Sodium: 140 mmol/L (ref 134–144)
Total Protein: 7.3 g/dL (ref 6.0–8.5)

## 2019-06-23 LAB — CBC WITH DIFFERENTIAL/PLATELET
Basophils Absolute: 0.1 10*3/uL (ref 0.0–0.2)
Basos: 1 %
EOS (ABSOLUTE): 0.4 10*3/uL (ref 0.0–0.4)
Eos: 6 %
Hematocrit: 40.9 % (ref 34.0–46.6)
Hemoglobin: 13.6 g/dL (ref 11.1–15.9)
Immature Grans (Abs): 0 10*3/uL (ref 0.0–0.1)
Immature Granulocytes: 0 %
Lymphocytes Absolute: 1.7 10*3/uL (ref 0.7–3.1)
Lymphs: 31 %
MCH: 31.1 pg (ref 26.6–33.0)
MCHC: 33.3 g/dL (ref 31.5–35.7)
MCV: 93 fL (ref 79–97)
Monocytes Absolute: 0.6 10*3/uL (ref 0.1–0.9)
Monocytes: 11 %
Neutrophils Absolute: 2.9 10*3/uL (ref 1.4–7.0)
Neutrophils: 51 %
Platelets: 255 10*3/uL (ref 150–450)
RBC: 4.38 x10E6/uL (ref 3.77–5.28)
RDW: 12.7 % (ref 11.7–15.4)
WBC: 5.6 10*3/uL (ref 3.4–10.8)

## 2019-06-23 LAB — LIPID PANEL
Chol/HDL Ratio: 4.1 ratio (ref 0.0–4.4)
Cholesterol, Total: 228 mg/dL — ABNORMAL HIGH (ref 100–199)
HDL: 55 mg/dL (ref >39)
LDL Chol Calc (NIH): 149 mg/dL — ABNORMAL HIGH (ref 0–99)
Triglycerides: 137 mg/dL (ref 0–149)
VLDL Cholesterol Cal: 24 mg/dL (ref 5–40)

## 2019-06-25 ENCOUNTER — Other Ambulatory Visit (INDEPENDENT_AMBULATORY_CARE_PROVIDER_SITE_OTHER): Payer: Self-pay | Admitting: Primary Care

## 2019-06-25 LAB — CYTOLOGY - PAP
Comment: NEGATIVE
Diagnosis: NEGATIVE
High risk HPV: NEGATIVE

## 2019-06-25 MED ORDER — ATORVASTATIN CALCIUM 20 MG PO TABS: 20.0000 mg | ORAL_TABLET | Freq: Every day | ORAL | 3 refills | Status: DC

## 2019-06-26 ENCOUNTER — Telehealth (INDEPENDENT_AMBULATORY_CARE_PROVIDER_SITE_OTHER): Payer: Self-pay

## 2019-06-26 NOTE — Telephone Encounter (Signed)
Call placed using pacific interpreter [phone]) patient verified date of birth. She is aware that labs are normal except slightly elevated cholesterol. Advised her to eat more veggies and whole grains. Advised to eat less fatty food, red meat, cheese and milk. She was also informed that her pap was normal and STD screening negative. She verbalized understanding of results. Maryjean Morn, CMA

## 2019-06-26 NOTE — Telephone Encounter (Signed)
-----   Message from [name], NP sent at 06/25/2019 10:13 AM EST ----- Decrease your fatty foods, red meat, cheese, milk and increase fiber like whole grains and veggies.

## 2019-07-24 ENCOUNTER — Telehealth (INDEPENDENT_AMBULATORY_CARE_PROVIDER_SITE_OTHER): Payer: Self-pay

## 2019-07-24 NOTE — Telephone Encounter (Signed)
Patient called requesting imaging per [facility]. Patient states [facility] advice she would need to call PCP and requesting imaging they are not able to put in orders withput PCP authorizing as patient advice.  Please advice [phone]

## 2019-07-24 NOTE — Telephone Encounter (Signed)
Please have patient contact [facility] for any concerns that they are managing her for. If the provider there wants imaging he/she will order it.

## 2019-07-25 NOTE — Telephone Encounter (Signed)
Patient aware.

## 2019-07-31 ENCOUNTER — Telehealth: Payer: Self-pay | Admitting: Family Medicine

## 2019-07-31 ENCOUNTER — Telehealth (INDEPENDENT_AMBULATORY_CARE_PROVIDER_SITE_OTHER): Payer: Self-pay

## 2019-07-31 NOTE — Telephone Encounter (Signed)
Patient called requesting MRI and referral to ENT. Patient states she spoke with [facility] and requested an MRI put was advice that her PCP had to place orders for the MRI as well as the ENT referral. Patient states [facility] gave medication to treat " allergy related symptoms " and medication is not helping at all.  Patient is requesting for orders and referral to be place if appropriate.  Please advice [phone]

## 2019-07-31 NOTE — Telephone Encounter (Signed)
Patient called stating she is needing to setup a MRI apt and a eye referral.  Pt needs an interpreter

## 2019-08-01 NOTE — Telephone Encounter (Signed)
I do not see where another MRI was advised. She can schedule an appt if new or worsening symptoms. I do not mind placing referral to eye doc but she doesn't need one. She can call and schedule with whomever she wishes.

## 2019-08-01 NOTE — Telephone Encounter (Signed)
[facility] has addressed the MRI with patient. Please referral for ENT if appropriate.

## 2019-08-02 NOTE — Telephone Encounter (Signed)
I called pt with language line (Natalie Waller [phone] and we made appt for pt.  The medicatons that she has tried have not been helpful.  She has pressure in head that causes eye sight vision change, and R swelling in eye.  Has popping sound in ear, neck , like tightness base of skull.  This has been ongoing for over 3 yrs.  No allergy med has helped.  It is not headache.  She has not seen eye doc, she can make appt on her own to have checked.  She req MRI.  I made appt for Monday 08/06/19 at 0930 (will plan on interpreter for her).  She was appreciative.

## 2019-08-03 NOTE — Telephone Encounter (Signed)
ID [id number]/Natalie Waller interpretor  Call patient to determine why she needed to see ENT when she saw the neurologist and MRI with results given an express to patient no need for another MRI to be done. Recommended eye exam. Advised to have eyes exam per neurologist. She has had this problem for 3 years  eye sight vision change, and R swelling in eye. Popping sound in ear, neck , like tightness base of skull.  She feels there is an infection something is not right advised to called to schedule appointment.

## 2019-08-06 ENCOUNTER — Other Ambulatory Visit: Payer: Self-pay

## 2019-08-06 ENCOUNTER — Ambulatory Visit (INDEPENDENT_AMBULATORY_CARE_PROVIDER_SITE_OTHER): Payer: Self-pay | Admitting: Family Medicine

## 2019-08-06 ENCOUNTER — Encounter: Payer: Self-pay | Admitting: Family Medicine

## 2019-08-06 VITALS — BP 106/66 | HR 75 | Temp 97.7°F | Ht 61.02 in | Wt 128.0 lb

## 2019-08-06 DIAGNOSIS — G44229 Chronic tension-type headache, not intractable: Secondary | ICD-10-CM

## 2019-08-06 DIAGNOSIS — M542 Cervicalgia: Secondary | ICD-10-CM

## 2019-08-06 DIAGNOSIS — J309 Allergic rhinitis, unspecified: Secondary | ICD-10-CM

## 2019-08-06 MED ORDER — GABAPENTIN 100 MG PO CAPS: 100.0000 mg | ORAL_CAPSULE | Freq: Three times a day (TID) | ORAL | 11 refills | Status: DC

## 2019-08-06 NOTE — Progress Notes (Signed)
I have read the note, and I agree with the clinical assessment and plan.  Lora Chavers A. Artis Beggs, MD, PhD, FAAN Certified in Neurology, Clinical Neurophysiology, Sleep Medicine, Pain Medicine and Neuroimaging  [facility] [address], [address] [place], [place] [address] [phone]  

## 2019-08-06 NOTE — Progress Notes (Signed)
PATIENT: Natalie Waller DOB: 11-Jul-1970  REASON FOR VISIT: follow up HISTORY FROM: patient  Chief Complaint  Patient presents with  . Follow-up    headaches, new room, with interpreter, pt states some headaches and pressure      HISTORY OF PRESENT ILLNESS: Today 08/06/19 Natalie Waller is a 49 y.o. female here today for follow up for headaches. She tried and failed imipramine in 01/2019. We started tizanidine as needed for neck tension and changed her allergy medications at last visit in 05/2019.  She reports that neither have helped headaches. She continues to have a pressure sensation behind her eyes and "popping noises" in her neck and back of head. Headaches are daily. No migrainous symptoms. Neck tension remains. Tizanidine did help with this but made her feel drunk. She does have occasional numbness/tingling of right arm and ring finger. This occurs about 2-3 times a month. This only occurs in the mornings and resolves when she gets up and moves around. She continues to have bilateral ear pain and itching, scratchy throat, swelling under her eyes. Allergy medications have not helped.  No vision changes. Her last eye exam normal 1 year ago. She does wear readers. She eats a well balanced diet. She exercises regularly. She cleans homes/store on the side.   She has tried and failed verapamil, topiramate, imipramine, tizanidine, antihistamines.   HISTORY: (copied from my note on 06/13/2019)  Natalie Waller is a 49 y.o. female here today for follow up for headaches. She was started on imipramine 25mg  at bedtime. She does not feel that it has helped at all. She reports a pressure sensation behind her eyes that occurs most days. No migraine symptoms. She has "inflammation" under her eyes. Her ears itch and she feels that something is moving in her ears. She also notes tension of her neck and shoulders. She exercises daily. She denies vision changes.   HISTORY: (copied from Dr  Garth Bigness note on 02/08/2019)  At the pleasure seeing your patient, Natalie Waller, a [facility] for neurologic consultation regarding her chronic headaches and neck pain.  She is a 49 year old woman with headaches that are mostly occipital for the past three years. The quality is pressure-like with occasional pulsing sensations. Sometimes she feels dizzy when the headache is worse. Moving her head does not alter pain much. She has no nausea or vomiting. The headache is daily but fluctuates. Nothing seems to make it better or worse. Prescription strength ibuprofen had not helped. She has not had any prescription   When the pain is worse she notes her vision is blurry. She also can't Sleep when pain is worse.   She feels a swollen sensation under her eyes. She also feels an itching sensation in her ears and they sometimes pop from pressure. She reports that no abnormalities were seen by ophthalmology in the past. She also reports having her ileus looked into it without any abnormality noted.  I personally reviewed the MRI of the brain performed 09/18/2015. It shows some scattered T2/flair hypertense foci, mostly subcortical white matter. None of the foci appear to be acute and they do not enhance.   REVIEW OF SYSTEMS: Out of a complete 14 system review of symptoms, the patient complains only of the following symptoms, headaches, neck tension and all other reviewed systems are negative.   ALLERGIES: No Known Allergies  HOME MEDICATIONS: Outpatient Medications Prior to Visit  Medication Sig Dispense Refill  . atorvastatin (LIPITOR)  20 MG tablet Take 1 tablet (20 mg total) by mouth daily. 90 tablet 3  . loratadine (CLARITIN) 10 MG tablet Take 1 tablet (10 mg total) by mouth daily. 30 tablet 11  . tizanidine (ZANAFLEX) 2 MG capsule Take 1 capsule (2 mg total) by mouth 3 (three) times daily. 30 capsule 11   No facility-administered medications  prior to visit.    PAST MEDICAL HISTORY: Past Medical History:  Diagnosis Date  . Environmental and seasonal allergies 01/03/2017  . Headache 2017  . Migraine   . Neck pain 2017  . Tension headache, chronic 04/27/2015    PAST SURGICAL HISTORY: Past Surgical History:  Procedure Laterality Date  . APPENDECTOMY  2009  . CESAREAN SECTION  1997    FAMILY HISTORY: Family History  Problem Relation Age of Onset  . Hepatitis C Mother     SOCIAL HISTORY: Social History   Socioeconomic History  . Marital status: Married    Spouse name: Alecia Lemming  . Number of children: 1  . Years of education: 5  . Highest education level: Not on file  Occupational History  . Occupation: unemployed     Comment: Previously worked as Education officer, environmental in a gym  Tobacco Use  . Smoking status: Never Smoker  . Smokeless tobacco: Never Used  Substance and Sexual Activity  . Alcohol use: No    Alcohol/week: 0.0 standard drinks  . Drug use: No  . Sexual activity: Yes    Birth control/protection: Condom  Other Topics Concern  . Not on file  Social History Narrative   C[place] In [place]   Married her current husband, Alecia Lemming May 2017   Lives at home with her husband   Caffeine use: very rare   Right handed   Social Determinants of Health   Financial Resource Strain:   . Difficulty of Paying Living Expenses:   Food Insecurity:   . Worried About Programme researcher, broadcasting/film/video in the Last Year:   . Barista in the Last Year:   Transportation Needs:   . Freight forwarder (Medical):   Marland Kitchen Lack of Transportation (Non-Medical):   Physical Activity:   . Days of Exercise per Week:   . Minutes of Exercise per Session:   Stress:   . Feeling of Stress :   Social Connections:   . Frequency of Communication with Friends and Family:   . Frequency of Social Gatherings with Friends and Family:   . Attends Religious Services:   . Active Member of Clubs or Organizations:   . Attends Banker Meetings:    Marland Kitchen Marital Status:   Intimate Partner Violence:   . Fear of Current or Ex-Partner:   . Emotionally Abused:   Marland Kitchen Physically Abused:   . Sexually Abused:       PHYSICAL EXAM  Vitals:   08/06/19 0933  BP: 106/66  Pulse: 75  Temp: 97.7 F (36.5 C)  Weight: 128 lb (58.1 kg)  Height: 5' 1.02" (1.55 m)   Body mass index is 24.17 kg/m.  Generalized: Well developed, in no acute distress  Cardiology: normal rate and rhythm, no murmur noted Respiratory: clear to auscultation bilaterally  Neurological examination  Mentation: Alert oriented to time, place, history taking. Follows all commands speech and language fluent Cranial nerve II-XII: Pupils were equal round reactive to light. Extraocular movements were full, visual field were full on confrontational test. Facial sensation and strength were normal. Uvula tongue midline. Head turning and shoulder  shrug  were normal and symmetric. Motor: The motor testing reveals 5 over 5 strength of all 4 extremities. Good symmetric motor tone is noted throughout.  Sensory: Sensory testing is intact to soft touch on all 4 extremities. No evidence of extinction is noted.  Coordination: Cerebellar testing reveals good finger-nose-finger and heel-to-shin bilaterally.  Gait and station: Gait is normal.   DIAGNOSTIC DATA (LABS, IMAGING, TESTING) - I reviewed patient records, labs, notes, testing and imaging myself where available.  No flowsheet data found.   Lab Results  Component Value Date   WBC 5.6 06/22/2019   HGB 13.6 06/22/2019   HCT 40.9 06/22/2019   MCV 93 06/22/2019   PLT 255 06/22/2019      Component Value Date/Time   NA 140 06/22/2019 0844   K 4.2 06/22/2019 0844   CL 102 06/22/2019 0844   CO2 24 06/22/2019 0844   GLUCOSE 89 06/22/2019 0844   GLUCOSE 105 (H) 08/28/2015 1911   BUN 9 06/22/2019 0844   CREATININE 0.75 06/22/2019 0844   CALCIUM 9.4 06/22/2019 0844   PROT 7.3 06/22/2019 0844   ALBUMIN 4.5 06/22/2019 0844   AST  30 06/22/2019 0844   ALT 36 (H) 06/22/2019 0844   ALKPHOS 67 06/22/2019 0844   BILITOT 0.4 06/22/2019 0844   GFRNONAA 95 06/22/2019 0844   GFRAA 109 06/22/2019 0844   Lab Results  Component Value Date   CHOL 228 (H) 06/22/2019   HDL 55 06/22/2019   LDLCALC 149 (H) 06/22/2019   TRIG 137 06/22/2019   CHOLHDL 4.1 06/22/2019   No results found for: HGBA1C Lab Results  Component Value Date   VITAMINB12 >2000 (H) 09/02/2015   No results found for: TSH     ASSESSMENT AND PLAN 49 y.o. year old female  has a past medical history of Environmental and seasonal allergies (01/03/2017), Headache (2017), Migraine, Neck pain (2017), and Tension headache, chronic (04/27/2015). here with     ICD-10-CM   1. Chronic tension-type headache, not intractable  G44.229   2. Neck pain  M54.2   3. Allergic rhinitis, unspecified seasonality, unspecified trigger  J30.9     She continues to have frequent tension style headaches and neck tension.  Cardizem did seem to help somewhat, however, she could not tolerate the dizziness.  We will start gabapentin 100 mg at bedtime.  She may titrate up to 100 g 3 times daily if well tolerated.  I have advised that she discuss neck tension as well as allergy symptoms with primary care.  Adequate hydration, well-balanced diet regular exercise advised.  She will follow-up with me in 3 months if no improvement in 1 year if she is doing well.  She verbalizes understanding and agreement with this plan.   No orders of the defined types were placed in this encounter.    Meds ordered this encounter  Medications  . gabapentin (NEURONTIN) 100 MG capsule    Sig: Take 1 capsule (100 mg total) by mouth 3 (three) times daily.    Dispense:  90 capsule    Refill:  11    Order Specific Question:   Supervising Provider    Answer:   Anson Fret [id number]      I spent 25 minutes with the patient. 50% of this time was spent counseling and educating patient on plan of care and  medications.    Shawnie Dapper, FNP-C 08/06/2019, 12:43 PM [facility] [address] [place], [place] [address] [phone]

## 2019-08-06 NOTE — Patient Instructions (Addendum)
We will try gabapentin  three times daily, start  every night for 1 week, then increase to  twice daily if well tolerated. Can increase to  three times daily if you feel this is helpful.   Follow up with PCP to rule out allergy/sinus concerns and for chronic neck pain. May consider PT again or pain management.   Follow up with me in 3 months if no better, 1 year if symptoms improve.   Gabapentin capsules or tablets O que  este medicamento? A GABAPENTINA  usada para controlar as crises convulsivas provocadas por alguns tipos de epilepsia. Tambm  usada para tratar alguns tipos de dor nevrlgica. Este medicamento pode ser usado para outros propsitos; em caso de dvidas, pergunte ao seu profissional de sade ou farmacutico. NOMES DE MARCAS COMUNS: Active-PAC with Gabapentin, Gabarone, Neurontin O que devo dizer a meu profissional de sade antes de tomar este medicamento? Precisam saber se voc tem algum dos seguintes problemas ou estados de sade:  histria pregressa de vcio em drogas ou alcoolismo  doenas renais  doenas pulmonares ou respiratrias  pensamentos suicidas ou planos de cometer suicdio  tentativa de suicdio (sua ou de um parente)  reao estranha ou alergia  gabapentina  reao estranha ou alergia a outros medicamentos  reao estranha ou alergia a alimentos, corantes ou conservantes  est grvida ou tentando engravidar  est [employer] devo usar este medicamento? Tome este medicamento por via oral com um copo d'gua. Siga as instrues na embalagem ou na bula. Voc pode tomar este medicamento com ou sem comida. Se este medicamento lhe fizer mal ao estmago, tome-o com comida. Tome este medicamento em intervalos regulares. No tome este medicamento com frequncia maior do que a indicada. No pare de usar PPL Corporation a menos que seu mdico [place]. Se for instrudo a partir os comprimidos de 600 ou 800 mg pela metade para compor a  sua dose, a metade restante do comprimido pode ser usada para compor a dose seguinte. Se no tiver usado o meio comprimido extra aps 28 dias, jogue-o fora. O farmacutico lhe dar um folheto informativo especial a cada compra do medicamento. No se esquea de ler atentamente essas informaes todas as vezes. Fale com seu pediatra a respeito do uso deste medicamento em crianas. Embora este medicamento possa ser receitado para crianas a Altria Group 3 anos de idade para certas condies, algumas precaues so necessrias. Superdosagem: Se achar que tomou uma superdosagem deste medicamento, entre em contato imediatamente com o [facility] [place] de Intoxicaes ou v a Health Net. OBSERVAO: Este medicamento  s para voc. No compartilhe este medicamento com outras pessoas. E se eu deixar de tomar uma dose? Se perder uma dose, tome-a assim que possvel. Se j estiver quase na hora da sua prxima dose, tome somente essa dose. No tome o remdio em dobro, nem tome uma dose adicional. O que pode interagir com este medicamento? Este medicamento pode interagir com os seguintes remdios:  lcool  anti-histamnicos para Programmer, multimedia, tosse e resfriado  alguns medicamentos para ansiedade ou problemas de sono  alguns medicamentos para depresso, como amitriptilina, fluoxetina ou nortriptilina  alguns medicamentos para crises convulsivas, como fenobarbital, primidona  alguns medicamentos para problemas de [address]  anestsicos gerais, como halotano, isoflurano, metoxiflurano ou propofol  anestsicos locais, como lidocana, pramocana, tetracana  relaxantes musculares para cirurgia  analgsicos narcticos  fenotiaznicos, como clorpromazina, mesoridazina, proclorperazina e tioridazina Esta lista pode no descrever todas as interaes possveis. D ao seu profissional de sade uma  lista de todos os medicamentos, ervas medicinais, remdios de venda livre, ou suplementos alimentares que voc  [place]. Diga tambm se voc fuma, bebe, ou [place] drogas ilcitas. Alguns destes podem interagir com o seu medicamento. Ao que devo ficar atento quando estiver [employer] medicamento? Consulte seu mdico ou profissional de sade para realizar um acompanhamento regular Education officer, museum. Randol Kern Sunnie Nielsen manter em casa um dirio de como sente que est sendo a sua resposta ao tratamento. Voc pode passar essa informao ao seu mdico ou profissional de sade em cada consulta. Em caso de Monsanto Company ou de algum novo tipo de crise [place], entre em contato com seu mdico ou profissional de sade. No pare de usar [employer] Corporation a menos que seu mdico Spencer. Parar este medicamento de repente pode aumentar a frequncia ou [facility]. Este medicamento pode provocar reaes cutneas graves. Estas podem ocorrer semanas ou meses depois de comear a tomar o medicamento. Em caso de febre ou sintomas gripais com erupo cutnea, entre em contato com seu mdico ou profissional de sade imediatamente. A erupo cutnea pode ter cor vermelha ou roxa e depois desenvolver bolhas ou descamao da pele. Ou poder notar uma erupo cutnea vermelha com inchao da face, dos lbios ou dos gnglios linfticos no pescoo ou Public Service Enterprise Group. Se toma este medicamento porque tem epilepsia ou convulses, use uma pulseira ou corrente de identificao mdica. Leve sempre consigo uma carteirinha de identificao com os detalhes do seu problema de sade e dos medicamentos que voc toma e com o nome do seu mdico. Voc pode ter sonolncia, tontura ou viso turva. No dirija, no opere mquinas e no faa nada que exija concentrao mental at U.S. Bancorp como o medicamento lhe afeta. No se sente nem se levante rpido demais, principalmente se for um paciente idoso. Isso diminui o risco de [place] ou desmaio. O lcool pode piorar a tontura e a sonolncia. Evite bebidas alcolicas. Voc pode ficar com a boca  seca. Mascar chiclete sem acar ou chupar balas pode ajudar. Beba bastante gua. O uso deste medicamento pode aumentar a probabilidade de pensamentos ou aes suicidas. Preste muita ateno  sua resposta a este medicamento. Em caso de qualquer piora do seu humor ou pensamentos a respeito de suicdio ou morte, avise seu mdico ou profissional de sade imediatamente. [place] que engravidarem enquanto estiverem tomando este medicamento podem se cadastrar no Glass blower/designer [place] de Medicamentos Antiepilpticos na Manufacturing systems engineer [phone] American Antiepileptic Drug Pregnancy Registry) pelo telefone [phone]. Este cadastro coleta informaes a Pensions consultant segurana do uso de medicamentos antiepilpticos na gravidez. Que efeitos colaterais posso sentir aps usar este medicamento? Efeitos colaterais que devem ser informados ao seu mdico ou profissional de sade o mais rpido possvel:  reaes alrgicas, como erupo na pele, coceira, urticria, ou inchao do rosto, dos lbios ou da lngua  dificuldade para respirar  erupo cutnea, febre e gnglios linfticos inchados  vermelhido, bolhas, descamao ou afrouxamento da pele, inclusive dentro da boca  pensamentos suicidas, alteraes do humor Efeitos colaterais que normalmente no precisam de cuidados mdicos (avise ao seu mdico ou profissional de sade se persistirem ou forem incmodos):  tontura  sonolncia  dor de cabea  enjoo ou vmitos  inchao dos tornozelos, ps ou mos  sensao de cansao Esta lista pode no descrever todos os efeitos colaterais possveis. Para mais orientaes sobre efeitos colaterais, consulte o seu mdico. Voc pode relatar a ocorrncia de efeitos colaterais  FDA pelo telefone [phone]. Onde devo guardar meu medicamento?  Mal MistyManter fora do Guardian Life Insurancealcance das crianas. Este medicamento pode causar superdosagem acidental e morte se for ingerido por outros adultos, crianas ou animais de Chesterestimao. Misture o  medicamento no utilizado com uma substncia, como areia para gatos ou borra de caf. Depois, descarte o medicamento em um recipiente fechado, como um saco lacrado ou uma lata com uma tampa. No use o medicamento aps a data de validade. Conservar em temperatura ambiente, entre 15 e 30 degreesC [phone](59 e 86 degreesF). OBSERVAO: Este folheto  um resumo. Pode no cobrir todas as informaes possveis. Se tiver dvidas a respeito deste medicamento, fale com seu mdico, farmacutico ou profissional de sade.  2020 Elsevier/Gold Standard (2018-09-11 00:00:00)    Cefalea tensional en los adultos Tension Headache, Adult La cefalea tensional es un dolor o una presin que se siente en la cabeza. Las cefaleas tensionales pueden durar de 30 minutos a [address]varios das. Siga estas indicaciones en su casa: Control del W. R. Berkleydolor  Tome los medicamentos de venta libre y los recetados solamente como se lo haya indicado el mdico.  Cuando tenga una cefalea, acustese en una habitacin oscura y silenciosa.  Si se lo indican, aplquese hielo en la cabeza y el cuello: ? Ponga el hielo en una bolsa plstica. ? Coloque una FirstEnergy Corptoalla entre la piel y la bolsa de hielo. ? Coloque el hielo durante 20minutos, 2 o 3veces por da.  Si se lo indican, aplquese calor en la nuca. Hgalo con la frecuencia que le haya dicho el mdico. Use el tipo de calor que el mdico le recomiende, como una compresa de calor hmedo o una almohadilla trmica. ? Coloque una FirstEnergy Corptoalla entre la piel y la fuente de Airline pilotcalor. ? Aplique el calor durante 20 a 30minutos. ? Retire la fuente de calor si la piel se pone de color rojo brillante. Comida y bebida  Mantenga un horario para las comidas.  Controle la cantidad de alcohol que bebe: ? Si es mujer y no est embarazada, no consuma ms de 1 bebida por Futures traderda. ? Si es hombre, no consuma ms de 2 bebidas por Futures traderda.  Beba suficiente lquido para Radio producermantener el pis (orina) de color amarillo plido.  No consuma  mucha cafena ni deje de consumirla por completo. Estilo de vida  Duerma lo suficiente. Duerma entre 7y 9horas todas las noches. O duerma la cantidad de Sempra Energytiempo que le indique el mdico.  A la hora de dormir, retire todos los dispositivos electrnicos de su habitacin. Algunos ejemplos de dispositivos electrnicos son las computadoras, los telfonos y las tabletas.  Encuentre maneras de Museum/gallery exhibitions officerreducir el estrs. Las siguientes son algunas cosas que pueden reducir el estrs: ? Actividad fsica. ? Respiracin profunda. ? Yoga. ? Msica. ? Pensamientos positivos.  Sintese con la espalda recta. No contraiga (tensione) los msculos.  No consuma ningn producto que contenga nicotina o tabaco, como cigarrillos y Administrator, Civil Servicecigarrillos electrnicos. Si necesita ayuda para dejar de fumar, consulte al mdico. Instrucciones generales   Concurra a todas las visitas de control como se lo haya indicado el mdico. Esto es importante.  Evite las cosas que puedan provocar las cefaleas. Lleve un registro diario para Financial risk analystaveriguar si ciertas cosas provocan los dolores de Turkmenistancabeza. Registre, por ejemplo, lo siguiente: ? Lo que usted come y bebe. ? Cunto tiempo duerme. ? Algn cambio en su dieta o en los medicamentos. Comunquese con un mdico si:  La cefalea no se alivia.  La cefalea regresa.  Tiene una cefalea y Citigrouple molestan los sonidos, la luz o los olores.  Tiene malestar estomacal (nuseas) o vomita.  Le duele el estmago. Solicite ayuda de inmediato si:  Siente un dolor de cabeza muy intenso de repente junto con alguno de estos sntomas: ? Rigidez en el cuello. ? Ganas de vomitar. ? Vmitos. ? Sensacin de debilidad. ? Dificultad para ver. ? Falta de aire. ? Erupcin cutnea. ? Somnolencia inusual. ? Dificultad para hablar. ? Dolor en el ojo o el odo. ? Dificultad para caminar o mantener el equilibrio. ? Sensacin de que se desvanecer (Geneticist, molecular). ? Desmayo. Resumen  La cefalea tensional es un  dolor o una presin que se siente en la cabeza.  Las cefaleas tensionales pueden durar de 30 minutos a varios das.  Los Levi Strauss en el estilo de vida y los medicamentos pueden ayudar a Best boy. Esta informacin no tiene Marine scientist el consejo del mdico. Asegrese de hacerle al mdico cualquier pregunta que tenga. Document Revised: 11/15/2016 Document Reviewed: 11/15/2016   Rinitis alrgica en adultos Allergic Rhinitis, Adult La rinitis alrgica es una reaccin a los alrgenos que se encuentran en el aire. Los alrgenos son las partculas minsculas que estn en el aire y que hacen que el cuerpo tenga una reaccin IT consultant. Esta afeccin no se puede transmitir de [place] persona a otra (no es contagiosa). La rinitis alrgica no se puede curar, pero puede controlarse. Sears Holdings Corporation tipos de rinitis alrgica:  Psychologist, counselling. Este tipo tambin se denomina fiebre del heno. Sucede nicamente durante ciertas pocas del ao.  Perenne. Este tipo puede ocurrir en cualquier momento del ao. Cules son las causas? Esta afeccin puede ser causada por lo siguiente:  El polen que proviene de los rboles, el pasto y las [place].  caros del polvo en Engineer, mining.  Caspa de las H[employer]el Foods.  Moho. Cules son los signos o los sntomas? Los sntomas de esta afeccin incluyen:  Estornudos.  Nariz tapada o que gotea (congestin nasal).  Abundante mucosidad en la parte posterior de la garganta (goteo posnasal).  Escozor en la Lawyer.  Lagrimeo.  Dificultad para dormir.  Estar somnoliento Agricultural consultant. Cmo se trata? No hay cura para esta afeccin. Debe evitar las cosas que desencadenan sus sntomas (alrgenos). El tratamiento puede ayudar a [employer] Corporation sntomas. Puede incluir:  Medicamentos que Du Pont sntomas de la [place], [place] antihistamnicos. Estos pueden administrarse en forma de inyeccin, aerosol nasal o comprimidos.  Vacunas que se administran hasta que el cuerpo se  vuelve menos sensible al alrgeno (desensibilizacin).  Medicamentos ms potentes, si todos los dems tratamientos no han sido eficaces. Siga estas indicaciones en su casa: Evite los alrgenos   Conozca a qu es Air cabin crew. Los alrgenos comunes incluyen el humo, polvo y [place].  Evtelos si puede. Hay algunas medidas que puede tomar para evitar los alrgenos: ? Auto-Owners Insurance alfombras por pisos de [place], baldosas o vinilo. Las alfombras pueden retener la caspa de los animales y [place]. ? Limpie cualquier moho que encuentre en la casa. ? No fume. No permita que fumen en su casa. ? Cambie el filtro de la calefaccin y del aire acondicionado al menos una vez al mes. ? Durante la temporada de alergias:  Mantenga las ventanas cerradas todo el tiempo posible. Si es posible, use aire acondicionado cuando hay mucho polen en el aire.  Use un filtro especial para alergias con la caldera y el aire acondicionado.  Planee actividades al aire libre cuando las concentraciones de polen estn en su nivel ms bajo. Normalmente, esto es por  la maana temprano o durante las horas de la noche.  Si sale al aire libre cuando la concentracin de polen es [place], use una mscara especial para personas con Environmental consultant.  Cuando vuelva al interior, dese una ducha y [place] de ropa antes de sentarse en los muebles o en la cama. Indicaciones generales  No use ventiladores en su hogar.  No cuelgue ropa en el exterior para que se seque.  Use gafas para el sol para mantener el polen alejado de los ojos.  Lvese las manos enseguida despus de tocar a las [place]net.  Tome los medicamentos de venta libre y los recetados solamente como se lo haya indicado el mdico.  Oceanographer a todas las visitas de control como se lo haya indicado el mdico. Esto es importante. Comunquese con un mdico si:  Tiene fiebre.  Tiene tos que no desaparece (es persistente).  Comienza a emitir un sonido agudo al respirar  (sibilancias).  Los sntomas no mejoran con Scientist, research (medical).  Le sale lquido espeso por la Clinical cytogeneticist.  Comienza a tener hemorragia nasal. Solicite ayuda inmediatamente si:  Tiene la boca o los labios hinchados.  Tiene dificultad para respirar.  Se siente mareado o como si se fuera a desmayar.  Tiene transpiracin fra. Resumen  La rinitis alrgica es una reaccin a los alrgenos que se encuentran en el aire.  Esta afeccin puede ser causada por alrgenos. Estos [employer], los caros del polvo, la caspa de las mascotas y Lawyer.  Los sntomas son goteo y picazn nasal, estornudos o Arboriculturist. Tambin es posible que tenga dificultad para dormir o que sienta sueo Administrator.  El tratamiento incluye tomar medicamentos y Secretary/administrator. Tambin es posible que deba recibir vacunas o tomar medicamentos ms potentes.  Solicite ayuda si tiene fiebre o tos que no se detiene. Solicite ayuda de inmediato si le falta el aire. Esta informacin no tiene Theme park manager el consejo del mdico. Asegrese de hacerle al mdico cualquier pregunta que tenga. Document Revised: 12/14/2017 Document Reviewed: 12/14/2017 Elsevier Patient Education  2020 Elsevier Inc.   Esguince cervical Cervical Sprain  Un esguince cervical es un estiramiento o desgarro en uno o ms de los resistentes tejidos tipo cuerdas que conectan los huesos (ligamentos) en la nuca. Un esguince cervical puede ser desde muy leve a muy grave. En los casos graves, el esguince cervical puede hacer que los huesos de la columna (vrtebras) en el cuello se vuelvan inestables. Esto puede causar un dao en la mdula espinal y puede dar Environmental consultant a graves problemas del [place]. La cantidad de tiempo que demora la mejora de un esguince cervical depende de la causa y de la extensin de la lesin. La mayora de las veces se curan en 4 a 6 semanas. Cules son las causas? Los esguinces cervicales pueden producirse por una  lesin (traumatismo) como, por ejemplo, en un accidente automovilstico, una cada o un movimiento brusco de adelante hacia atrs de la cabeza y el cuello (latigazo cervical). Los esguinces cervicales leves pueden producirse por el deterioro con el paso del [place], [place], por ejemplo, una postura incorrecta, sentarse en una silla que no proporciona soporte o mirar hacia arriba y Nolic abajo durante largos perodos de [place]. Qu incrementa el riesgo? Los siguientes factores pueden hacer que usted sea ms propenso a sufrir esta afeccin:  Arts development officer que conllevan un alto riesgo de sufrir un traumatismo en el cuello. Estos incluyen los deportes de [place], las carreras de  automviles, la gimnasia y [place].  Asumir riesgos al conducir o Hershey Company en un vehculo motorizado, como ir a muy altas velocidades.  Tener artrosis de columna.  Tener poca fuerza y flexibilidad en el cuello.  Tener una lesin previa en el cuello.  Tener mala postura.  Estar durante mucho tiempo en ciertas posiciones que estresan el cuello, como sentarse delante de la computadora durante largos perodos de [place]. Cules son los signos o los sntomas? Los sntomas de esta afeccin incluyen lo siguiente:  Dolor, inflamacin, entumecimiento, dolor a la palpacin, hinchazn o una sensacin de ardor en el frente, la parte posterior y los laterales del cuello.  Tensin repentina en los msculos del cuello que no puede controlar (espasmos musculares).  Dolor en los hombros o la parte superior de la espalda.  Capacidad limitada para mover el cuello.  Dolor de [place].  Mareos.  Nuseas.  Vmitos.  Debilidad, adormecimiento u hormigueo en la mano o el brazo. Los sntomas pueden manifestarse inmediatamente despus de la lesin o en el transcurso de [place]. En algunos casos, los sntomas pueden desaparecer con tratamiento y volver a aparecer ms adelante. Cmo se diagnostica? Esta afeccin se puede  diagnosticar en funcin de lo siguiente:  Sus antecedentes mdicos.  Sus sntomas.  Cualquier lesin reciente o problemas conocidos en el cuello que tenga, como artritis en el cuello.  Un examen fsico.  Pruebas de diagnstico por imgenes, como: ? Radiografas. ? Resonancia magntica (RM). ? Exploracin por tomografa computarizada (TC). Cmo se trata esta afeccin? Esta afeccin se trata con reposo y la aplicacin de hielo en la zona lesionada, y con ejercicios de fisioterapia. El tratamiento vara en funcin de la gravedad de la afeccin y puede incluir lo siguiente:  La inmovilizacin del cuello durante perodos de [place]. Esto se puede hacer usando lo siguiente: ? Un collarn cervical. Este sostiene el mentn y la parte posterior del cuello. ? Un dispositivo de traccin cervical. Se trata de un cabestrillo que sostiene el cuello. Esto [place] peso y presin del cuello, y puede ayudar a Engineer, materials.  Medicamentos que ayudan a Engineer, materials y la inflamacin.  Medicamentos que [employer]ley[employer]son msculos (relajantes musculares).  Ciruga. Esto es raro. Siga estas indicaciones en su casa: Si tiene un collarn cervical:   selo como se lo haya indicado el mdico. No se quite el collarn excepto que se lo indique su mdico.  Consulte al mdico antes de hacerle ajustes al collarn.  Si tiene National Oilwell Varco, mantngalo fuera del collarn.  Pregntele al mdico si puede quitarse el collarn para baarse e higienizarse. Si lo autorizan a Warehouse manager para baarse o higienizarse: ? Siga las indicaciones del mdico acerca de cmo quitarse el collarn de manera segura. ? Para limpiar el collarn, psele un pao con agua y [place], y squelo bien. ? Si el collarn tiene almohadillas desmontables, qutelas cada 1 o 2das y lvelas a mano con agua y [place]. Djelas que se sequen por completo antes de volver a ponerlas en el collarn. ? Contrlese la piel debajo del  collarn para ver si hay irritacin o llagas. Si las tiene, informe a su mdico. Control del dolor, de la rigidez y de la hinchazn   Si se lo indican, use un dispositivo de traccin cervical como le haya explicado el mdico.  Si se lo indican, aplique calor en la zona afectada antes de hacer los ejercicios de fisioterapia o con la frecuencia que le haya  indicado el mdico. Use la fuente de calor que el mdico le recomiende, como una compresa de calor hmedo o una almohadilla trmica. ? Coloque una FirstEnergy Corp piel y la fuente de Airline pilot. ? Aplique el calor durante 20 a . ? Retire la fuente de calor si la piel se le pone de color rojo brillante. Esto es muy importante si no puede sentir el dolor, el calor o el fro. Puede correr un riesgo mayor de sufrir quemaduras.  Si se lo indican, aplique hielo sobre la zona afectada: ? Ponga el hielo en una bolsa plstica. ? Coloque una FirstEnergy Corp piel y la bolsa de hielo. ? Coloque el hielo durante , 2 a 3veces por da. Actividad  No conduzca mientras [place] un collarn cervical. Si no tiene un collarn cervical, pregntele al mdico si es seguro que conduzca durante el proceso de curacin del cuello.  No conduzca ni opere maquinaria pesada mientras toma analgsicos o relajantes musculares recetados, a menos que el mdico lo haya autorizado.  No levante objetos que pesen ms de 10libras (4,5kg) hasta que el mdico le diga que es seguro.  Haga reposo como se lo haya indicado el mdico. Evite las posiciones y actividades que Country[employer] Financial sntomas. Pregntele al mdico qu actividades son seguras para usted.  Si le indican fisioterapia, haga los ejercicios como se lo haya indicado el mdico o el fisioterapeuta. Instrucciones generales  Baxter International de venta libre y los recetados solamente como se lo haya indicado el mdico.  No consuma ningn producto que contenga nicotina o tabaco, como cigarrillos y Soil scientist. Estos pueden retrasar la recuperacin. Si necesita ayuda para dejar de fumar, consulte al mdico.  Concurra a todas las visitas de control como se lo haya indicado el mdico. Esto es importante. Cmo se previene? Para evitar que se produzca nuevamente un esguince cervical:  Mantenga una buena postura. Haga los ajustes necesarios en su lugar de trabajo para favorecer una buena postura.  Haga ejercicio regularmente como se lo haya indicado el fisioterapeuta o su mdico.  Evite realizar actividades de riesgo que puedan causar un esguince cervical. Comunquese con un mdico si:  Los sntomas empeoran o no mejoran despus de 2semanas de [place].  Tiene un dolor que empeora o que no mejora con los medicamentos.  Le aparecen sntomas nuevos e inexplicables.  Tiene llagas o la piel irritada en el cuello por el uso del collarn cervical. Solicite ayuda de inmediato si:  Siente dolor intenso.  Siente adormecimiento, hormigueo o debilidad en cualquier parte del cuerpo.  No puede mover una parte del cuerpo (tiene parlisis).  Siente dolor en el cuello junto con: ? Mareos intensos. ? Dolor de [place]. Resumen  Un esguince cervical es un estiramiento o desgarro en uno o ms de los resistentes tejidos tipo cuerdas que [employer] (ligamentos) en la nuca.  Los esguinces cervicales pueden producirse por una lesin (traumatismo) como, por ejemplo, en un accidente automovilstico, una cada o un movimiento brusco de adelante hacia atrs de la cabeza y el cuello (latigazo cervical).  Los sntomas pueden manifestarse inmediatamente despus de la lesin o en el transcurso de [place].  Esta afeccin se trata con reposo y la aplicacin de hielo en la zona lesionada, y con ejercicios de fisioterapia. Esta informacin no tiene Theme park manager el consejo del mdico. Asegrese de hacerle al mdico cualquier pregunta que tenga. Document Revised: 07/12/2016 Document  Reviewed: 10/18/2012 Elsevier Patient Education  2020 [employer].  Elsevier Patient Education  [employer].

## 2019-08-08 ENCOUNTER — Encounter (INDEPENDENT_AMBULATORY_CARE_PROVIDER_SITE_OTHER): Payer: Self-pay | Admitting: Primary Care

## 2019-08-08 ENCOUNTER — Other Ambulatory Visit: Payer: Self-pay

## 2019-08-08 ENCOUNTER — Ambulatory Visit (INDEPENDENT_AMBULATORY_CARE_PROVIDER_SITE_OTHER): Payer: Self-pay | Admitting: Primary Care

## 2019-08-08 VITALS — BP 116/82 | HR 107 | Temp 97.3°F | Ht 61.0 in | Wt 129.2 lb

## 2019-08-08 DIAGNOSIS — H9313 Tinnitus, bilateral: Secondary | ICD-10-CM

## 2019-08-08 DIAGNOSIS — S0300XA Dislocation of jaw, unspecified side, initial encounter: Secondary | ICD-10-CM

## 2019-08-08 DIAGNOSIS — J3089 Other allergic rhinitis: Secondary | ICD-10-CM

## 2019-08-08 MED ORDER — FLUTICASONE PROPIONATE 50 MCG/ACT NA SUSP: 2.0000 | Freq: Every day | NASAL | 6 refills | Status: DC

## 2019-08-08 MED ORDER — LEVOCETIRIZINE DIHYDROCHLORIDE 5 MG PO TABS: 5.0000 mg | ORAL_TABLET | Freq: Every evening | ORAL | 3 refills | Status: DC

## 2019-08-08 NOTE — Progress Notes (Signed)
Acute Office Visit  Subjective:    Patient ID: Natalie Waller, female    DOB: 15-Nov-1970, 49 y.o.   MRN: [id number]  Chief Complaint  Patient presents with  . Otalgia    bilateral     HPI Ms. Natalie Waller is a 49 year old Hispanic female ( interpretor Gerald Stabs [id number])KG in today for an acute visit. She is appropriately dressed and oriented times 4. Her concern is pressure on her head near her ears,  popping in her ears and  swollen watery eyes   Past Medical History:  Diagnosis Date  . Environmental and seasonal allergies 01/03/2017  . Headache 2017  . Migraine   . Neck pain 2017  . Tension headache, chronic 04/27/2015    Past Surgical History:  Procedure Laterality Date  . APPENDECTOMY  2009  . CESAREAN SECTION  1997    Family History  Problem Relation Age of Onset  . Hepatitis C Mother     Social History   Socioeconomic History  . Marital status: Married    Spouse name: Christy Sartorius  . Number of children: 1  . Years of education: 79  . Highest education level: Not on file  Occupational History  . Occupation: unemployed     Comment: Previously worked as Education administrator in a gym  Tobacco Use  . Smoking status: Never Smoker  . Smokeless tobacco: Never Used  Substance and Sexual Activity  . Alcohol use: No    Alcohol/week: 0.0 standard drinks  . Drug use: No  . Sexual activity: Yes    Birth control/protection: Condom  Other Topics Concern  . Not on file  Social History Narrative   [place]   Married her current husband, Christy Sartorius May 2017   Lives at home with her husband   Caffeine use: very rare   Right handed   Social Determinants of Health   Financial Resource Strain:   . Difficulty of Paying Living Expenses:   Food Insecurity:   . Worried About Charity fundraiser in the Last Year:   . Arboriculturist in the Last Year:   Transportation Needs:   . Film/video editor (Medical):   Marland Kitchen Lack of Transportation (Non-Medical):   Physical  Activity:   . Days of Exercise per Week:   . Minutes of Exercise per Session:   Stress:   . Feeling of Stress :   Social Connections:   . Frequency of Communication with Friends and Family:   . Frequency of Social Gatherings with Friends and Family:   . Attends Religious Services:   . Active Member of Clubs or Organizations:   . Attends Archivist Meetings:   Marland Kitchen Marital Status:   Intimate Partner Violence:   . Fear of Current or Ex-Partner:   . Emotionally Abused:   Marland Kitchen Physically Abused:   . Sexually Abused:     Outpatient Medications Prior to Visit  Medication Sig Dispense Refill  . gabapentin (NEURONTIN) 100 MG capsule Take 1 capsule (100 mg total) by mouth 3 (three) times daily. 90 capsule 11   No facility-administered medications prior to visit.    No Known Allergies  Review of Systems  HENT: Positive for ear pain.   All other systems reviewed and are negative.      Objective:    Physical Exam Vitals reviewed.  Constitutional:      Appearance: Normal appearance. She is normal weight.  HENT:     Head:  Normocephalic.     Right Ear: Tympanic membrane normal.     Left Ear: Tympanic membrane normal.  Cardiovascular:     Pulses: Normal pulses.     Heart sounds: Normal heart sounds.  Pulmonary:     Effort: Pulmonary effort is normal.     Breath sounds: Normal breath sounds.  Abdominal:     General: Bowel sounds are normal.  Skin:    General: Skin is warm and dry.  Neurological:     Mental Status: She is alert and oriented to person, place, and time.  Psychiatric:        Mood and Affect: Mood normal.        Behavior: Behavior normal.        Thought Content: Thought content normal.        Judgment: Judgment normal.     BP 116/82 (BP Location: Right Arm, Patient Position: Sitting, Cuff Size: Normal)   Pulse (!) 107   Temp (!) 97.3 F (36.3 C) (Temporal)   Ht 5\' 1"  (1.549 m)   Wt 129 lb 3.2 oz (58.6 kg)   LMP 08/06/2019 (Exact Date)   SpO2 98%    BMI 24.41 kg/m  Wt Readings from Last 3 Encounters:  08/08/19 129 lb 3.2 oz (58.6 kg)  08/06/19 128 lb (58.1 kg)  06/21/19 127 lb 12.8 oz (58 kg)    There are no preventive care reminders to display for this patient.  There are no preventive care reminders to display for this patient.   No results found for: TSH Lab Results  Component Value Date   WBC 5.6 06/22/2019   HGB 13.6 06/22/2019   HCT 40.9 06/22/2019   MCV 93 06/22/2019   PLT 255 06/22/2019   Lab Results  Component Value Date   NA 140 06/22/2019   K 4.2 06/22/2019   CO2 24 06/22/2019   GLUCOSE 89 06/22/2019   BUN 9 06/22/2019   CREATININE 0.75 06/22/2019   BILITOT 0.4 06/22/2019   ALKPHOS 67 06/22/2019   AST 30 06/22/2019   ALT 36 (H) 06/22/2019   PROT 7.3 06/22/2019   ALBUMIN 4.5 06/22/2019   CALCIUM 9.4 06/22/2019   ANIONGAP 12 08/28/2015   Lab Results  Component Value Date   CHOL 228 (H) 06/22/2019   Lab Results  Component Value Date   HDL 55 06/22/2019   Lab Results  Component Value Date   LDLCALC 149 (H) 06/22/2019   Lab Results  Component Value Date   TRIG 137 06/22/2019   Lab Results  Component Value Date   CHOLHDL 4.1 06/22/2019   No results found for: HGBA1C     Assessment & Plan:  06/24/2019 was seen today for otalgia.  Diagnoses and all orders for this visit:  Dislocation of temporomandibular joint, initial encounter Popping in hear ears actually was from her jaw. Explained she needed to see a dentist . States she has a cleaning next week wrote down TMJ and provided information on AVS  Tinnitus of both ears Feels popping and ringing in her ears all the time. Requesting a ENT referral she is addiment about some bacteria in her ears.  Environmental and seasonal allergies High levels of pollen and wearing mask is causing symptoms from allergies watery eyes, throat and sneezing prescribe antihistimene  Other orders -     fluticasone (FLONASE) 50 MCG/ACT nasal spray; Place 2  sprays into both nostrils daily. -     levocetirizine (XYZAL) 5 MG tablet; Take 1 tablet (5  mg total) by mouth every evening.  Meds ordered this encounter  Medications  . fluticasone (FLONASE) 50 MCG/ACT nasal spray    Sig: Place 2 sprays into both nostrils daily.    Dispense:  16 g    Refill:  6  . levocetirizine (XYZAL) 5 MG tablet    Sig: Take 1 tablet (5 mg total) by mouth every evening.    Dispense:  90 tablet    Refill:  3     [name], NP

## 2019-08-08 NOTE — Patient Instructions (Signed)
Tinnitus Tinnitus El trmino tinnitus hace referencia a la percepcin de un sonido que no se corresponde con ninguna fuente real para ese sonido. A menudo se lo describe como zumbido de odos. Sin embargo, las personas que sufren esta afeccin pueden percibir diferentes ruidos, en uno o ambos odos. Los sonidos del tinnitus pueden ser [place], fuertes o de intensidad intermedia. El tinnitus puede prolongarse pocos segundos o ser constante durante [address]. Puede desaparecer sin tratamiento y regresar en distintos momentos. Cuando el tinnitus es permanente u ocurre con frecuencia, puede causar otros problemas, por ejemplo, dificultad para dormir y para concentrarse. Casi todas las Environmental health practitioner tinnitus en algn momento. El tinnitus a Air cabin crew (crnico) o que regresa con frecuencia (recurrente) es un problema que puede requerir Psychologist, prison and probation services. Cules son las causas? A menudo se desconoce la causa del tinnitus. En algunos casos, puede ser consecuencia de lo siguiente:  Exposicin a ruidos fuertes de maquinarias, [place] u otras fuentes.  Un objeto (cuerpo extrao) atascado en el odo.  Acumulacin de cerumen.  El consumo de alcohol o cafena.  Tomar ciertos medicamentos.  La prdida [place] relacionada con la edad. Tambin puede ser causada por afecciones mdicas tales como:  Infecciones de los odos o de los senos paranasales.  Presin arterial alta.  Enfermedades cardacas.  Anemia.  Alergias.  Enfermedad de [place].  Problemas de tiroides.  Tumores.  Un vaso sanguneo dbil o abultado (aneurisma) cerca del odo. Cules son los signos o sntomas? El principal sntoma de tinnitus es la percepcin de un sonido que no se corresponde con ninguna fuente, no proviene de Dealer. El sonido puede percibirse como lo siguiente:  Vibracin.  Crepitacin.  Zumbido.  Soplido de aire.  Sibilancia.  Silbido.  Chisporroteo.  Runrn.  Una corriente de  agua.  Una nota musical.  Golpecitos. Es posible que los sntomas afecten un solo odo (unilateral) o ambos odos (bilateral). Cmo se diagnostica? El tinnitus se diagnostica en funcin de los sntomas, antecedentes mdicos y un examen fsico. El mdico puede realizar una prueba auditiva exhaustiva (examen de audicin) si el tinnitus:  Es unilateral.  Causa dificultades [phone].  Dura ms de . Usted puede trabajar con un mdico especialista en trastornos [employer] (audilogo). Le pueden hacer preguntas sobre sus sntomas y cmo estos afectan su vida diaria. Es posible que le hagan algunas pruebas, por ejemplo:  Exploracin por tomografa computarizada (TC).  Resonancia magntica (RM).  Un estudio de diagnstico por imgenes de cmo fluye la sangre a travs de los vasos sanguneos (angiograma). Cmo se trata? A veces, el tratamiento de una enfermedad preexistente hace que el tinnitus desaparezca. Si el tinnitus contina, probablemente debe realizar [employer] siguientes tratamientos, [place] otros:  Medicamentos.  Terapia y orientacin para ayudarlo a controlar el estrs que significa vivir con tinnitus.  Generadores de sonido para enmascarar el tinnitus. Esto incluye lo siguiente: ? Aparatos de sonido de mesa que reproducen sonidos relajantes para ayudarlo a dormir. ? Dispositivos inteligentes que se adaptan al odo y reproducen sonidos o [place]. ? Estimulacin Patent examiner usar auriculares para escuchar msica que contiene Librarian, academic. Con el tiempo, escuchar esta seal puede modificar las redes del cerebro y reducir la sensibilidad al tinnitus. Este tratamiento se [place] en casos muy graves cuando ningn otro tratamiento resulta eficaz.  El uso de audfonos o implantes cocleares, si el tinnitus guarda relacin con la prdida de la audicin. Los audfonos se usan en el odo externo. Implantes cocleares se colocan  quirrgicamente en el odo  interno. Siga estas instrucciones en su casa: Controlar los sntomas      Cuando sea posible, no permanezca en lugares ruidosos y no se exponga a sonidos fuertes.  Use dispositivos de proteccin de la audicin, por ejemplo, tapones, cuando est expuesto a ruidos fuertes.  Use un aparato de sonido de fondo, un humidificador u otros dispositivos para enmascarar el sonido del tinnitus.  Ponga en prctica tcnicas para reducir el estrs, como meditacin, yoga o respiracin profunda. Trabaje con el mdico si necesita ayuda para Dealer.  Duerma con la cabeza levemente elevada. Esto puede reducir el impacto del tinnitus. Instrucciones generales  No use sustancias estimulantes, como nicotina, alcohol o cafena. Hable con el mdico sobre otros estimulantes que Water engineer. Los estimulantes son sustancias que pueden hacer que se sienta alerta y atento al aumentar determinadas actividades en el cuerpo (por ejemplo, la frecuencia cardaca y la presin arterial). Estas sustancias pueden empeorar el tinnitus.  Use los medicamentos de venta libre y los recetados solamente como se lo haya indicado el mdico.  Intente dormir mucho todas las noches.  Concurra a todas las visitas de [address] se lo haya indicado el mdico. Esto es importante. Comunquese con un mdico si:  El tinnitus se prolonga durante 3semanas o ms tiempo y no se detiene.  Pierde la audicin de forma repentina.  Los sntomas empeoran o no mejoran con los Medical laboratory scientific officer.  Siente que no puede controlar el estrs que le provoca vivir con tinnitus. Solicite ayuda de inmediato si:  Experimenta tinnitus despus de sufrir una lesin en la cabeza.  Tiene tinnitus junto con alguno de estos sntomas: ? Mareos. ? Prdida del equilibrio. ? Nuseas y vmitos. ? Dolor de cabeza repentino e intenso. Estos sntomas pueden representar un problema grave que constituye Radio broadcast assistant. No espere a ver si los sntomas  desaparecen. Solicite atencin mdica de inmediato. Comunquese con el servicio de emergencias de su localidad (911 en los [place]). No conduzca por sus propios medios [facility]. Resumen  El trmino tinnitus hace referencia a la percepcin de un sonido que no se corresponde con ninguna fuente real para ese sonido. A menudo se lo describe como zumbido de odos.  Es posible que los sntomas afecten un solo odo (unilateral) o ambos odos (bilateral).  Use un aparato de sonido de fondo, un humidificador u otros dispositivos para enmascarar el sonido del tinnitus.  No use sustancias estimulantes, como nicotina, alcohol o cafena. Hable con el mdico sobre otros estimulantes que Water engineer. Estas sustancias pueden empeorar el tinnitus. Esta informacin no tiene Theme park manager el consejo del mdico. Asegrese de hacerle al mdico cualquier pregunta que tenga. Document Revised: 12/27/2018 Document Reviewed: 12/27/2018 Elsevier Patient Education  2020 [employer].

## 2019-09-10 ENCOUNTER — Telehealth: Payer: Self-pay

## 2019-09-10 ENCOUNTER — Ambulatory Visit: Payer: Self-pay | Admitting: Family Medicine

## 2019-09-10 ENCOUNTER — Encounter: Payer: Self-pay | Admitting: Family Medicine

## 2019-09-10 NOTE — Telephone Encounter (Signed)
Patient no-showed today's appointment with np 

## 2019-09-18 ENCOUNTER — Ambulatory Visit: Payer: Self-pay

## 2019-09-18 ENCOUNTER — Other Ambulatory Visit: Payer: Self-pay

## 2019-09-20 ENCOUNTER — Inpatient Hospital Stay: Admission: RE | Admit: 2019-09-20 | Payer: No Typology Code available for payment source | Source: Ambulatory Visit

## 2019-09-20 ENCOUNTER — Other Ambulatory Visit: Payer: Self-pay | Admitting: Primary Care

## 2019-09-20 DIAGNOSIS — Z1231 Encounter for screening mammogram for malignant neoplasm of breast: Secondary | ICD-10-CM

## 2019-09-26 ENCOUNTER — Ambulatory Visit: Payer: No Typology Code available for payment source

## 2019-09-26 ENCOUNTER — Ambulatory Visit (INDEPENDENT_AMBULATORY_CARE_PROVIDER_SITE_OTHER): Payer: No Typology Code available for payment source | Admitting: Primary Care

## 2019-09-27 ENCOUNTER — Telehealth (INDEPENDENT_AMBULATORY_CARE_PROVIDER_SITE_OTHER): Payer: No Typology Code available for payment source | Admitting: Primary Care

## 2019-10-02 ENCOUNTER — Encounter (INDEPENDENT_AMBULATORY_CARE_PROVIDER_SITE_OTHER): Payer: Self-pay | Admitting: Primary Care

## 2019-10-02 ENCOUNTER — Telehealth (INDEPENDENT_AMBULATORY_CARE_PROVIDER_SITE_OTHER): Payer: No Typology Code available for payment source | Admitting: Primary Care

## 2019-10-02 ENCOUNTER — Other Ambulatory Visit: Payer: Self-pay

## 2019-10-02 DIAGNOSIS — J3089 Other allergic rhinitis: Secondary | ICD-10-CM

## 2019-10-02 NOTE — Progress Notes (Addendum)
Virtual Visit via Telephone Note  I connected with Natalie Waller on 10/02/19 at  8:50 AM EDT by telephone and verified that I am speaking with the correct person using two identifiers.   I discussed the limitations, risks, security and privacy concerns of performing an evaluation and management service by telephone and the availability of in person appointments. I also discussed with the patient that there may be a patient responsible charge related to this service. The patient expressed understanding and agreed to proceed. Patient location unknown  Natalie Passe, NP location [facility]  History of Present Illness: Ms. Natalie Waller is having a 49 year old female having a tele visit for allergies her symptoms are right eye always a little swollen ears feeling they are exploding and pressure in her head.  Past Medical History:  Diagnosis Date  . Environmental and seasonal allergies 01/03/2017  . Headache 2017  . Migraine   . Neck pain 2017  . Tension headache, chronic 04/27/2015   Current Outpatient Medications on File Prior to Visit  Medication Sig Dispense Refill  . fluticasone (FLONASE) 50 MCG/ACT nasal spray Place 2 sprays into both nostrils daily. 16 g 6  . gabapentin (NEURONTIN) 100 MG capsule Take 1 capsule (100 mg total) by mouth 3 (three) times daily. 90 capsule 11  . levocetirizine (XYZAL) 5 MG tablet Take 1 tablet (5 mg total) by mouth every evening. (Patient not taking: Reported on 10/02/2019) 90 tablet 3   No current facility-administered medications on file prior to visit.   Observations/Objective: Review of Systems  HENT: Positive for congestion, ear pain and sinus pain.   Eyes:       Watery eyes  Neurological: Positive for headaches.  All other systems reviewed and are negative.   Assessment and Plan: [name] was seen today for allergies and medication management.  Diagnoses and all orders for this visit:  Environmental and seasonal  allergies She has medication for allergies but since she feels they were not working she stopped explain something is better than nothing while waiting for a referral for a allergist.    Follow Up Instructions:    I discussed the assessment and treatment plan with the patient. The patient was provided an opportunity to ask questions and all were answered. The patient agreed with the plan and demonstrated an understanding of the instructions.   The patient was advised to call back or seek an in-person evaluation if the symptoms worsen or if the condition fails to improve as anticipated.  I provided 10 minutes of non-face-to-face time during this encounter.   [name], NP

## 2019-10-02 NOTE — Progress Notes (Signed)
Right eye always a little swollen  Ears feeling they are exploding  Pressure in her head. Pt does not believe this is related to allergies. Especially since the medication is not working.

## 2019-11-20 ENCOUNTER — Encounter (INDEPENDENT_AMBULATORY_CARE_PROVIDER_SITE_OTHER): Payer: Self-pay | Admitting: Primary Care

## 2019-11-20 ENCOUNTER — Ambulatory Visit (INDEPENDENT_AMBULATORY_CARE_PROVIDER_SITE_OTHER): Payer: Self-pay | Admitting: Primary Care

## 2019-11-20 ENCOUNTER — Other Ambulatory Visit: Payer: Self-pay

## 2019-11-20 VITALS — BP 124/78 | HR 82 | Temp 98.5°F | Ht 59.5 in | Wt 128.6 lb

## 2019-11-20 DIAGNOSIS — F5101 Primary insomnia: Secondary | ICD-10-CM

## 2019-11-20 DIAGNOSIS — F32A Depression, unspecified: Secondary | ICD-10-CM

## 2019-11-20 DIAGNOSIS — E7800 Pure hypercholesterolemia, unspecified: Secondary | ICD-10-CM

## 2019-11-20 DIAGNOSIS — F329 Major depressive disorder, single episode, unspecified: Secondary | ICD-10-CM

## 2019-11-20 DIAGNOSIS — G43009 Migraine without aura, not intractable, without status migrainosus: Secondary | ICD-10-CM

## 2019-11-20 MED ORDER — TRAZODONE HCL 50 MG PO TABS: 100.0000 mg | ORAL_TABLET | Freq: Every day | ORAL | 1 refills | Status: DC

## 2019-11-20 NOTE — Patient Instructions (Signed)
Insomnio Insomnia El insomnio es un trastorno del sueo que causa dificultades para conciliar el sueo o para mantenerlo. Puede producir fatiga, falta de energa, dificultad para concentrarse, cambios en el estado de nimo y mal rendimiento escolar o laboral. Hay tres formas diferentes de clasificar el insomnio:  Dificultad para conciliar el sueo.  Dificultad para mantener el sueo.  Despertar muy precoz por la maana. Cualquier tipo de insomnio puede ser a largo plazo (crnico) o a corto plazo (agudo). Ambos son frecuentes. Generalmente, el insomnio a corto plazo dura tres meses o menos tiempo. El crnico ocurre al menos tres veces por semana durante ms de tres meses. Cules son las causas? El insomnio puede deberse a otra afeccin, situacin o sustancia, por ejemplo:  Ansiedad.  Ciertos medicamentos.  Enfermedad de reflujo gastroesofgico (ERGE) u otras enfermedades gastrointestinales.  Asma y otras enfermedades respiratorias.  Sndrome de las piernas inquietas, apnea del sueo u otros trastornos del sueo.  Dolor crnico.  Menopausia.  Accidente cerebrovascular.  Consumo excesivo de alcohol, tabaco u drogas ilegales.  Afecciones de salud mental, como depresin.  Cafena.  Trastornos neurolgicos, como enfermedad de Alzheimer.  Hiperactividad de la glndula tiroidea (hipertiroidismo). En ocasiones, la causa del insomnio puede ser desconocida. Qu incrementa el riesgo? Los factores de riesgo de tener insomnio incluyen lo siguiente:  Sexo. La enfermedad afecta ms a menudo a las mujeres que a los hombres.  Edad. El insomnio es ms frecuente a medida que una persona envejece.  Estrs.  La falta de actividad fsica.  Los horarios de trabajo irregulares o los turnos nocturnos.  Los viajes a lugares de diferentes zonas horarias.  Ciertas afecciones mdicas y de salud mental. Cules son los signos o los sntomas? Si tiene insomnio, el sntoma principal es la  dificultad para conciliar el sueo o mantenerlo. Esto puede derivar en otros sntomas, por ejemplo:  Sentirse fatigado o tener poca energa.  Ponerse nervioso por tener que irse a dormir.  No sentirse descansado por la maana.  Tener dificultad para concentrarse.  Sentirse irritable, ansioso o deprimido. Cmo se diagnostica? Esta afeccin se puede diagnosticar en funcin de lo siguiente:  Los sntomas y antecedentes mdicos. El mdico puede hacerle preguntas sobre: ? Hbitos de sueo. ? Cualquier afeccin mdica que tenga. ? La salud mental.  Un examen fsico. Cmo se trata? El tratamiento para el insomnio depende de la causa. El tratamiento puede centrarse en tratar una afeccin preexistente que causa el insomnio. El tratamiento tambin puede incluir lo siguiente:  Medicamentos que lo ayuden a dormir.  Asesoramiento psicolgico o terapia.  Ajustes en el estilo de vida para ayudarlo a dormir mejor. Siga estas indicaciones en su casa: Comida y bebida   Limite o evite el consumo de alcohol, bebidas con cafena y cigarrillos, especialmente cerca de la hora de acostarse, ya que pueden perturbarle el sueo.  No consuma una comida suculenta ni coma alimentos condimentados justo antes de la hora de acostarse. Esto puede causarle molestias digestivas y dificultades para dormir. Hbitos de sueo   Lleve un registro del sueo ya que podra ser de utilidad para que usted y a su mdico puedan determinar qu podra estar causndole insomnio. Escriba los siguientes datos: ? Cundo duerme. ? Cundo se despierta durante la noche. ? Qu tan bien duerme. ? Qu tan relajado se siente al da siguiente. ? Cualquier efecto secundario de los medicamentos que toma. ? Lo que usted come y bebe.  Convierta su habitacin en un lugar oscuro, cmodo donde sea fcil   conciliar el sueo. ? Coloque persianas o cortinas oscuras que impidan la entrada de la luz del exterior. ? Para bloquear los ruidos,  use un aparato que reproduzca sonidos ambientales o relajantes de fondo. ? Mantenga baja la temperatura.  Limite el uso de pantallas antes de la hora de acostarse. Esto incluye lo siguiente: ? Mirar televisin. ? Usar el telfono inteligente, la tableta o la computadora.  Siga una rutina que incluya ir a dormir y despertarse a la misma hora cada da y noche. Esto puede ayudarlo a conciliar el sueo ms rpidamente. Considere realizar una actividad tranquila, como leer, e incorporarla como parte de la rutina a la hora de irse a dormir.  Trate de evitar tomar siestas durante el da para que pueda dormir mejor por la noche.  Levntese de la cama si sigue despierto despus de 15minutos de haber intentado dormirse. Mantenga bajas las luces, pero intente leer o hacer una actividad tranquila. Cuando tenga sueo, regrese a la cama. Instrucciones generales  Tome los medicamentos de venta libre y los recetados solamente como se lo haya indicado el mdico.  Realice ejercicio con regularidad como se lo haya indicado el mdico. Evite la actividad fsica desde varias horas antes de irse a dormir.  Utilice tcnicas de relajacin para controlar el estrs. Pdale al mdico que le sugiera algunas tcnicas que sean adecuadas para usted. Estos pueden incluir lo siguiente: ? Ejercicios de respiracin. ? Rutinas para aliviar la tensin muscular. ? Visualizacin de escenas apacibles.  Conduzca con cuidado. No conduzca si est muy somnoliento.  Concurra a todas las visitas de control como se lo haya indicado el mdico. Esto es importante. Comunquese con un mdico si:  Est cansado durante todo el da.  Tiene dificultad en su rutina diaria debido a la somnolencia.  Sigue teniendo problemas para dormir o estos empeoran. Solicite ayuda de inmediato si:  Piensa seriamente en lastimarse a usted mismo o a otra persona. Si alguna vez siente que puede lastimarse a usted mismo o a otras personas, o tiene  pensamientos de poner fin a su vida, busque ayuda de inmediato. Puede dirigirse al servicio de emergencias ms cercano o comunicarse con:  El servicio de emergencias de su localidad (911 en [place]  Una lnea de asistencia al suicida y atencin en crisis, como la Lnea Nacional de Prevencin del Suicidio (National Suicide Prevention Lifeline), al 1-800-273-8255. Est disponible las 24 horas del da. Resumen  El insomnio es un trastorno del sueo que causa dificultades para conciliar el sueo o para mantenerlo.  El insomnio puede ser a largo plazo (crnico) o a corto plazo (agudo).  El tratamiento para el insomnio depende de la causa. El tratamiento puede centrarse en tratar una afeccin preexistente que causa el insomnio.  Lleve un registro del sueo ya que podra ser de utilidad para que usted y a su mdico puedan determinar qu podra estar causndole insomnio. Esta informacin no tiene como fin reemplazar el consejo del mdico. Asegrese de hacerle al mdico cualquier pregunta que tenga. Document Revised: 07/23/2017 Document Reviewed: 04/01/2017 Elsevier Patient Education  2020 Elsevier Inc.  

## 2019-11-20 NOTE — Progress Notes (Signed)
Established Patient Office Visit  Subjective:  Patient ID: Natalie Waller, female    DOB: 1971-03-25  Age: 49 y.o. MRN: [id number]  CC:  Chief Complaint  Patient presents with  . Headache  . Insomnia    tired/dizzy     HPI Natalie Waller is a 49 year old Hispanic female presents for headaches followed neurology refer back to them.  Tired and dizzy from insomnia. Blood pressure unremarkable . Insomnia can be the underlying cause of headache. Past Medical History:  Diagnosis Date  . Environmental and seasonal allergies 01/03/2017  . Headache 2017  . Migraine   . Neck pain 2017  . Tension headache, chronic 04/27/2015    Past Surgical History:  Procedure Laterality Date  . APPENDECTOMY  2009  . CESAREAN SECTION  1997    Family History  Problem Relation Age of Onset  . Hepatitis C Mother     Social History   Socioeconomic History  . Marital status: Married    Spouse name: Natalie Waller  . Number of children: 1  . Years of education: 72  . Highest education level: Not on file  Occupational History  . Occupation: unemployed     Comment: Previously worked as Education officer, environmental in a gym  Tobacco Use  . Smoking status: Never Smoker  . Smokeless tobacco: Never Used  Substance and Sexual Activity  . Alcohol use: No    Alcohol/week: 0.0 standard drinks  . Drug use: No  . Sexual activity: Yes    Birth control/protection: Condom  Other Topics Concern  . Not on file  Social History Narrative   C[place] to [place] In [place]   Married her current husband, Natalie Waller May 2017   Lives at home with her husband   Caffeine use: very rare   Right handed   Social Determinants of Health   Financial Resource Strain:   . Difficulty of Paying Living Expenses:   Food Insecurity:   . Worried About Programme researcher, broadcasting/film/video in the Last Year:   . Barista in the Last Year:   Transportation Needs:   . Freight forwarder (Medical):   Marland Kitchen Lack of Transportation (Non-Medical):   Physical  Activity:   . Days of Exercise per Week:   . Minutes of Exercise per Session:   Stress:   . Feeling of Stress :   Social Connections:   . Frequency of Communication with Friends and Family:   . Frequency of Social Gatherings with Friends and Family:   . Attends Religious Services:   . Active Member of Clubs or Organizations:   . Attends Banker Meetings:   Marland Kitchen Marital Status:   Intimate Partner Violence:   . Fear of Current or Ex-Partner:   . Emotionally Abused:   Marland Kitchen Physically Abused:   . Sexually Abused:     Outpatient Medications Prior to Visit  Medication Sig Dispense Refill  . gabapentin (NEURONTIN) 100 MG capsule Take 1 capsule (100 mg total) by mouth 3 (three) times daily. 90 capsule 11  . fluticasone (FLONASE) 50 MCG/ACT nasal spray Place 2 sprays into both nostrils daily. 16 g 6  . levocetirizine (XYZAL) 5 MG tablet Take 1 tablet (5 mg total) by mouth every evening. (Patient not taking: Reported on 10/02/2019) 90 tablet 3   No facility-administered medications prior to visit.    No Known Allergies  ROS Review of Systems  Constitutional: Positive for appetite change and fatigue.  Neurological: Positive for dizziness  and headaches.  Psychiatric/Behavioral: Positive for sleep disturbance.  All other systems reviewed and are negative.     Objective:    Physical Exam Vitals reviewed.  Constitutional:      Appearance: She is well-developed.  HENT:     Head: Normocephalic.  Eyes:     Extraocular Movements: Extraocular movements intact.  Cardiovascular:     Rate and Rhythm: Normal rate and regular rhythm.     Heart sounds: Normal heart sounds.  Pulmonary:     Effort: Pulmonary effort is normal.     Breath sounds: Normal breath sounds.  Abdominal:     General: Bowel sounds are normal.  Musculoskeletal:        General: Normal range of motion.     Cervical back: Normal range of motion.  Skin:    General: Skin is warm and dry.  Neurological:      Mental Status: She is alert and oriented to person, place, and time.  Psychiatric:        Mood and Affect: Mood normal.     BP 124/78 (BP Location: Right Arm, Patient Position: Sitting, Cuff Size: Normal)   Pulse 82   Temp 98.5 F (36.9 C) (Oral)   Ht 4' 11.5" (1.511 m)   Wt 128 lb 9.6 oz (58.3 kg)   SpO2 96%   BMI 25.54 kg/m  Wt Readings from Last 3 Encounters:  11/20/19 128 lb 9.6 oz (58.3 kg)  08/08/19 129 lb 3.2 oz (58.6 kg)  08/06/19 128 lb (58.1 kg)     Health Maintenance Due  Topic Date Due  . Hepatitis C Screening  Never done    There are no preventive care reminders to display for this patient.  No results found for: TSH Lab Results  Component Value Date   WBC 5.6 06/22/2019   HGB 13.6 06/22/2019   HCT 40.9 06/22/2019   MCV 93 06/22/2019   PLT 255 06/22/2019   Lab Results  Component Value Date   NA 140 06/22/2019   K 4.2 06/22/2019   CO2 24 06/22/2019   GLUCOSE 89 06/22/2019   BUN 9 06/22/2019   CREATININE 0.75 06/22/2019   BILITOT 0.4 06/22/2019   ALKPHOS 67 06/22/2019   AST 30 06/22/2019   ALT 36 (H) 06/22/2019   PROT 7.3 06/22/2019   ALBUMIN 4.5 06/22/2019   CALCIUM 9.4 06/22/2019   ANIONGAP 12 08/28/2015   Lab Results  Component Value Date   CHOL 228 (H) 06/22/2019   Lab Results  Component Value Date   HDL 55 06/22/2019   Lab Results  Component Value Date   LDLCALC 149 (H) 06/22/2019   Lab Results  Component Value Date   TRIG 137 06/22/2019   Lab Results  Component Value Date   CHOLHDL 4.1 06/22/2019   No results found for: HGBA1C    Assessment & Plan:  Natalie Waller was seen today for headache and insomnia.  Diagnoses and all orders for this visit:  Migraine without aura and without status migrainosus, not intractable Followed by neurology   Depression, unspecified depression type   Office Visit from 11/20/2019 in [facility]  PHQ-9 Total Score 15    Follow up with CSW   Primary insomnia   Insomnia Onset: 4 years  Pattern:    Difficulty going to sleep: yes     Frequent awakening: yes     Early awakening: yes  Nightmares: no Abnormal leg movement: no  Snoring: no Apnea:no Risk factors/sleep hygiene:  Stimulants: no     Alcohol intake: no    Reading, watching TV, eating @ bedtime: no    Daytime naps: no     Stress/anxiety: yes    Work/travel factors: yes does not feel good  Prescribe trazodone 50mg  at bedtime follow up in 2 months to re-evaluate  Hypercholesterolemia Aware of elevated cholesterol and risk of heart and stroke  Refuses medication.  Follow-up: Return in about 2 months (around 01/21/2020) for effectiveness of medication for insomnia tele+.    01/23/2020, NP

## 2019-11-23 ENCOUNTER — Other Ambulatory Visit: Payer: Self-pay

## 2019-11-23 ENCOUNTER — Emergency Department (HOSPITAL_COMMUNITY)
Admission: EM | Admit: 2019-11-23 | Discharge: 2019-11-23 | Disposition: A | Discharge status: Home / Self Care | Payer: No Typology Code available for payment source | Attending: Emergency Medicine | Admitting: Emergency Medicine

## 2019-11-23 ENCOUNTER — Emergency Department (HOSPITAL_COMMUNITY): Payer: No Typology Code available for payment source

## 2019-11-23 ENCOUNTER — Encounter (HOSPITAL_COMMUNITY): Payer: Self-pay | Admitting: Emergency Medicine

## 2019-11-23 DIAGNOSIS — R519 Headache, unspecified: Secondary | ICD-10-CM | POA: Insufficient documentation

## 2019-11-23 DIAGNOSIS — Z8669 Personal history of other diseases of the nervous system and sense organs: Secondary | ICD-10-CM | POA: Insufficient documentation

## 2019-11-23 LAB — CBC WITH DIFFERENTIAL/PLATELET
Abs Immature Granulocytes: 0.02 10*3/uL (ref 0.00–0.07)
Basophils Absolute: 0 10*3/uL (ref 0.0–0.1)
Basophils Relative: 1 %
Eosinophils Absolute: 0.2 10*3/uL (ref 0.0–0.5)
Eosinophils Relative: 3 %
HCT: 40.6 % (ref 36.0–46.0)
Hemoglobin: 13.5 g/dL (ref 12.0–15.0)
Immature Granulocytes: 0 %
Lymphocytes Relative: 38 %
Lymphs Abs: 2.4 10*3/uL (ref 0.7–4.0)
MCH: 31 pg (ref 26.0–34.0)
MCHC: 33.3 g/dL (ref 30.0–36.0)
MCV: 93.1 fL (ref 80.0–100.0)
Monocytes Absolute: 0.6 10*3/uL (ref 0.1–1.0)
Monocytes Relative: 10 %
Neutro Abs: 3 10*3/uL (ref 1.7–7.7)
Neutrophils Relative %: 48 %
Platelets: 265 10*3/uL (ref 150–400)
RBC: 4.36 MIL/uL (ref 3.87–5.11)
RDW: 12.6 % (ref 11.5–15.5)
WBC: 6.3 10*3/uL (ref 4.0–10.5)
nRBC: 0 % (ref 0.0–0.2)

## 2019-11-23 LAB — BASIC METABOLIC PANEL
Anion gap: 7 (ref 5–15)
BUN: <5 mg/dL — ABNORMAL LOW (ref 6–20)
CO2: 27 mmol/L (ref 22–32)
Calcium: 9.6 mg/dL (ref 8.9–10.3)
Chloride: 104 mmol/L (ref 98–111)
Creatinine, Ser: 0.72 mg/dL (ref 0.44–1.00)
GFR calc Af Amer: >60 mL/min (ref >60)
GFR calc non Af Amer: >60 mL/min (ref >60)
Glucose, Bld: 101 mg/dL — ABNORMAL HIGH (ref 70–99)
Potassium: 3.8 mmol/L (ref 3.5–5.1)
Sodium: 138 mmol/L (ref 135–145)

## 2019-11-23 NOTE — Discharge Instructions (Signed)
Take ibuprofen 600 mg rotated with Tylenol 1000 mg every 4 hours as needed for pain.  You can follow-up with neurology in the next week to discuss your ongoing headache.  The contact information for [facility] neurology has been provided in this discharge summary for you to make these arrangements.  Call them if you have not heard from them by Tuesday of next week.  Return to the ER if your symptoms significantly worsen or change.

## 2019-11-23 NOTE — ED Triage Notes (Signed)
Pt. Stated, this has been going on for 4 years.Marland Kitchen

## 2019-11-23 NOTE — ED Triage Notes (Signed)
Translator/pt stated, Im having head Pressure, Ive seen Dr. For being tired . Its mostly in my sinus area.

## 2019-11-23 NOTE — ED Provider Notes (Signed)
[facility] EMERGENCY DEPARTMENT Provider Note   CSN: [id number] Arrival date & time: 11/23/19  1248     History Chief Complaint  Patient presents with  . Headache    Natalie Waller is a 49 y.o. female.  Patient is a 49 year old female with past medical history of migraines, tension headaches.  She presents today for evaluation of headache.  Patient states that she has had pressure in her head that has been ongoing for many years.  It seems to be worsening over the past several days.  She has what she describes as a "bubble" in the back of her head that makes her neck sore.  She denies any fevers or chills.  She denies any weakness or visual disturbances.  Patient tells me she had seen a doctor 2 years ago and had imaging studies and blood work done, however no cause was found.  She denies any aggravating or alleviating factors.  The history is provided by the patient.       Past Medical History:  Diagnosis Date  . Environmental and seasonal allergies 01/03/2017  . Headache 2017  . Migraine   . Neck pain 2017  . Tension headache, chronic 04/27/2015    Patient Active Problem List   Diagnosis Date Noted  . Ear itching 02/08/2019  . Mixed hyperlipidemia 06/15/2018  . Hyperglycemia 06/15/2018  . Hypercholesterolemia 01/05/2018  . Migraine without aura and without status migrainosus, not intractable 01/05/2018  . Environmental and seasonal allergies 01/03/2017  . Migraine 02/12/2016  . Paresthesia 09/02/2015  . Neck pain 04/27/2015  . Tension headache, chronic 04/27/2015    Past Surgical History:  Procedure Laterality Date  . APPENDECTOMY  2009  . CESAREAN SECTION  1997     OB History   No obstetric history on file.     Family History  Problem Relation Age of Onset  . Hepatitis C Mother     Social History   Tobacco Use  . Smoking status: Never Smoker  . Smokeless tobacco: Never Used  Substance Use Topics  . Alcohol use: No     Alcohol/week: 0.0 standard drinks  . Drug use: No    Home Medications Prior to Admission medications   Medication Sig Start Date End Date Taking? Authorizing Provider  fluticasone (FLONASE) 50 MCG/ACT nasal spray Place 2 sprays into both nostrils daily. 08/08/19   [name], NP  gabapentin (NEURONTIN) 100 MG capsule Take 1 capsule (100 mg total) by mouth 3 (three) times daily. 08/06/19   Lomax, Amy, NP  levocetirizine (XYZAL) 5 MG tablet Take 1 tablet (5 mg total) by mouth every evening. Patient not taking: Reported on 10/02/2019 08/08/19   [name], NP  traZODone (DESYREL) 50 MG tablet Take 2 tablets (100 mg total) by mouth at bedtime. 11/20/19   [name], NP    Allergies    Patient has no known allergies.  Review of Systems   Review of Systems  All other systems reviewed and are negative.   Physical Exam Updated Vital Signs BP 118/71 (BP Location: Left Arm)   Pulse 67   Temp 98.7 F (37.1 C) (Oral)   Resp 16   LMP 10/28/2019   SpO2 100%   Physical Exam Vitals and nursing note reviewed.  Constitutional:      General: She is not in acute distress.    Appearance: She is well-developed. She is not diaphoretic.  HENT:     Head: Normocephalic and atraumatic.  Eyes:     General: No visual field deficit.    Extraocular Movements: Extraocular movements intact.     Pupils: Pupils are equal, round, and reactive to light.  Cardiovascular:     Rate and Rhythm: Normal rate and regular rhythm.     Heart sounds: No murmur heard.  No friction rub. No gallop.   Pulmonary:     Effort: Pulmonary effort is normal. No respiratory distress.     Breath sounds: Normal breath sounds. No wheezing.  Abdominal:     General: Bowel sounds are normal. There is no distension.     Palpations: Abdomen is soft.     Tenderness: There is no abdominal tenderness.  Musculoskeletal:        General: Normal range of motion.     Cervical back: Normal range of motion and neck  supple.  Skin:    General: Skin is warm and dry.  Neurological:     Mental Status: She is alert and oriented to person, place, and time.     Cranial Nerves: No cranial nerve deficit or dysarthria.     ED Results / Procedures / Treatments   Labs (all labs ordered are listed, but only abnormal results are displayed) Labs Reviewed  BASIC METABOLIC PANEL  CBC WITH DIFFERENTIAL/PLATELET  LYME DISEASE DNA BY PCR(BORRELIA BURG)    EKG None  Radiology No results found.  Procedures Procedures (including critical care time)  Medications Ordered in ED Medications - No data to display  ED Course  I have reviewed the triage vital signs and the nursing notes.  Pertinent labs & imaging results that were available during my care of the patient were reviewed by me and considered in my medical decision making (see chart for details).    MDM Rules/Calculators/A&P  Patient is a 49 year old female presenting with complaints of headache.  From what she tells me, this has been going on for many years, but has worsened recently.  She also describes feeling fatigued.  Patient's vital signs are stable and her physical examination is unremarkable.  She is neurologically intact and head CT is negative.  Laboratory studies show no sign of electrolyte or CBC abnormality.  Patient also concerned about Salmonella, however this does not fit the clinical picture.  Patient has no history of tick bite, but due to the prolonged nature of her symptoms, a Lyme's titer was added.  At this point, I feel as though patient is appropriate for discharge.  Nothing today appears emergent.  I will have her follow-up with neurology if her headache is not improving in the near future.  Final Clinical Impression(s) / ED Diagnoses Final diagnoses:  None    Rx / DC Orders ED Discharge Orders    None       Geoffery Lyons, MD 11/23/19 2318

## 2019-11-29 ENCOUNTER — Encounter: Payer: Self-pay | Admitting: Allergy

## 2019-11-29 ENCOUNTER — Other Ambulatory Visit: Payer: Self-pay

## 2019-11-29 ENCOUNTER — Ambulatory Visit: Payer: No Typology Code available for payment source | Admitting: Allergy

## 2019-11-29 VITALS — BP 112/70 | HR 66 | Temp 98.2°F | Resp 16 | Ht 60.25 in | Wt 125.0 lb

## 2019-11-29 DIAGNOSIS — L299 Pruritus, unspecified: Secondary | ICD-10-CM

## 2019-11-29 DIAGNOSIS — R6889 Other general symptoms and signs: Secondary | ICD-10-CM

## 2019-11-29 NOTE — Patient Instructions (Addendum)
 -   environmental allergy skin testing is negative  - common food allergy skin testing is negative  - for itchy, watery, or red eyes use Olopatadine 0.2% 1 drop each eye daily as needed.  If this doesn't help itchy eyes then recommend seeing an eye doctor.    - you are not getting any benefit from Flonase thus would recommend stopping.  You can use Flonase if you are having nasal congestion.    - try antihistamine either Ryvent, Ryclora or Karbinal and take a dose twice a day.  These are similar to Claritin, Zyrtec, Allegra, Xyzal but require twice a day dosing at minimum.  If these are effective let us know  Follow-up as needed

## 2019-11-29 NOTE — Progress Notes (Signed)
New Patient Note  RE: Natalie Waller MRN: [id number] DOB: 04-26-1971 Date of Office Visit: 11/29/2019  Referring provider: [name], NP Primary care provider: [name], NP  Chief Complaint: itchy eyes and ears  History of present illness: Natalie Waller is a 49 y.o. female presenting today for consultation for allergies.  Spanish translator present today.   She states was referred for allergy testing.  She states her eye itches and she notes swelling of lower lid.  She also has itchy ears.  Symptoms are year-round and ongoing for about 4 years ago.  Denies nasal symptoms.  Reports having a little sneezing.  She has tried medications to help but states nothing has been effective.  She is taking xyzal for past 1-2 years but has not helped.  She has tried zyrtec and other over-the-counter antihistamines but nothing has helped.  She does use flonase as recommended by her PCP as states she was advised to take it.  She does 1 spray each nostril for past 1-2 years.  She has taken breaks off the flonase and does not note any differences. She has used an eyedrop before to "clean her eyes".    No history of asthma, eczema, or food allergy.  She doesn't really eat any breads and doesn't drink milk.  She states she tries to eat a very healthy diet.     Review of systems: Review of Systems  Constitutional: Negative.   HENT: Negative.   Eyes: Negative.   Respiratory: Negative.   Cardiovascular: Negative.   Gastrointestinal: Negative.   Musculoskeletal: Negative.   Skin: Positive for itching. Negative for rash.  Neurological: Negative.     All other systems negative unless noted above in HPI  Past medical history: Past Medical History:  Diagnosis Date  . Environmental and seasonal allergies 01/03/2017  . Headache 2017  . Migraine   . Neck pain 2017  . Tension headache, chronic 04/27/2015    Past surgical history: Past Surgical History:  Procedure  Laterality Date  . APPENDECTOMY  2009  . CESAREAN SECTION  1997    Family history:  Family History  Problem Relation Age of Onset  . Hepatitis C Mother     Social history: Lives in a home without carpeting with gas heating and Window units.  There are no pets in the home.  There is no concern for roaches in the home.  She denies a smoking history.  She does not report a occupation.  Medication List: Current Outpatient Medications  Medication Sig Dispense Refill  . fluticasone (FLONASE) 50 MCG/ACT nasal spray Place 2 sprays into both nostrils daily. 16 g 6  . gabapentin (NEURONTIN) 100 MG capsule Take 1 capsule (100 mg total) by mouth 3 (three) times daily. 90 capsule 11  . traZODone (DESYREL) 50 MG tablet Take 2 tablets (100 mg total) by mouth at bedtime. 30 tablet 1  . levocetirizine (XYZAL) 5 MG tablet Take 1 tablet (5 mg total) by mouth every evening. (Patient not taking: Reported on 10/02/2019) 90 tablet 3   No current facility-administered medications for this visit.    Known medication allergies: No Known Allergies   Physical examination: Blood pressure 112/70, pulse 66, temperature 98.2 F (36.8 C), resp. rate 16, height 5' 0.25" (1.53 m), weight 125 lb (56.7 kg), SpO2 97 %.  General: Alert, interactive, in no acute distress. HEENT: PERRLA, TMs pearly gray, turbinates non-edematous without discharge, post-pharynx non erythematous. Neck: Supple without lymphadenopathy. Lungs: Clear  to auscultation without wheezing, rhonchi or rales. {no increased work of breathing. CV: Normal S1, S2 without murmurs. Abdomen: Nondistended, nontender. Skin: Warm and dry, without lesions or rashes. Extremities:  No clubbing, cyanosis or edema. Neuro:   Grossly intact.  Diagnositics/Labs: Allergy testing: environmental allergy skin prick testing is negative.  Intradermal skin testing is negative.  10 Common food allergy skin prick testing is negative.  Allergy testing results were read  and interpreted by provider, documented by clinical staff.   Assessment and plan: Pruritus, eyes and ears     Non-allergic    - environmental allergy skin testing is negative  - common food allergy skin testing is negative  - for itchy, watery, or red eyes use Olopatadine 0.2% 1 drop each eye daily as needed.  If this doesn't help itchy eyes then recommend seeing an eye doctor.    - you are not getting any benefit from Flonase thus would recommend stopping.  You can use Flonase if you are having nasal congestion.    - try antihistamine either Ryvent, Ryclora or Karbinal and take a dose twice a day.  These are similar to Claritin, Zyrtec, Allegra, Xyzal but require twice a day dosing at minimum.  If these are effective let us know  Follow-up as needed  I appreciate the opportunity to take part in Natalie Waller's care. Please do not hesitate to contact me with questions.  Sincerely,   Margo Aye, MD Allergy/Immunology [facility]

## 2019-12-18 ENCOUNTER — Ambulatory Visit (INDEPENDENT_AMBULATORY_CARE_PROVIDER_SITE_OTHER): Payer: No Typology Code available for payment source | Admitting: Family Medicine

## 2019-12-18 ENCOUNTER — Other Ambulatory Visit: Payer: Self-pay

## 2019-12-18 ENCOUNTER — Encounter (INDEPENDENT_AMBULATORY_CARE_PROVIDER_SITE_OTHER): Payer: Self-pay | Admitting: Family Medicine

## 2019-12-18 DIAGNOSIS — J3089 Other allergic rhinitis: Secondary | ICD-10-CM

## 2019-12-18 NOTE — Progress Notes (Signed)
Patient is needing a referral to an allegry specialist due to her still having itching in her eyes and ears.  States that she has had an allergy panel ans it was negative.

## 2019-12-18 NOTE — Progress Notes (Signed)
Virtual Visit via Telephone Note  I connected with Lockie Pares, on 12/18/2019 at 8:45 AM by telephone due to the COVID-19 pandemic and verified that I am speaking with the correct person using two identifiers.   Consent: I discussed the limitations, risks, security and privacy concerns of performing an evaluation and management service by telephone and the availability of in person appointments. I also discussed with the patient that there may be a patient responsible charge related to this service. The patient expressed understanding and agreed to proceed.   Location of Patient: Home  Location of Provider: Clinic   Persons participating in Telemedicine visit: Katlyn Muldrew [facility], Interpreter ID # [id number] Dr. Alvis Lemmings     History of Present Illness: 49 year old female here with the following concerns. She would like to see a specialist as she has itching in eyes ears and throat. Itching has been present for the last 3 years. She states her antihistamines and nasal spray have been ineffective. Feels like she has pressure in her ears. Med list reveals she should be on Xyzal and Flonase which she informs me her PCP had advised to discontinue.  Past Medical History:  Diagnosis Date  . Environmental and seasonal allergies 01/03/2017  . Headache 2017  . Migraine   . Neck pain 2017  . Tension headache, chronic 04/27/2015   No Known Allergies  Current Outpatient Medications on File Prior to Visit  Medication Sig Dispense Refill  . fluticasone (FLONASE) 50 MCG/ACT nasal spray Place 2 sprays into both nostrils daily. (Patient not taking: Reported on 12/18/2019) 16 g 6  . gabapentin (NEURONTIN) 100 MG capsule Take 1 capsule (100 mg total) by mouth 3 (three) times daily. (Patient not taking: Reported on 12/18/2019) 90 capsule 11  . levocetirizine (XYZAL) 5 MG tablet Take 1 tablet (5 mg total) by mouth every evening. (Patient not taking: Reported on 10/02/2019) 90  tablet 3  . traZODone (DESYREL) 50 MG tablet Take 2 tablets (100 mg total) by mouth at bedtime. (Patient not taking: Reported on 12/18/2019) 30 tablet 1   No current facility-administered medications on file prior to visit.    Observations/Objective: Awake, alert, oriented x3 Not in acute distress  Assessment and Plan: 1. Environmental and seasonal allergies Uncontrolled Would love to place on Patanol, Allegra and a nasal spray which she is opposed to and insists on an Allergy referral which I have placed per request. - Ambulatory referral to Allergy   Follow Up Instructions: Return if symptoms worsen or fail to improve.    I discussed the assessment and treatment plan with the patient. The patient was provided an opportunity to ask questions and all were answered. The patient agreed with the plan and demonstrated an understanding of the instructions.   The patient was advised to call back or seek an in-person evaluation if the symptoms worsen or if the condition fails to improve as anticipated.     I provided 11 minutes total of non-face-to-face time during this encounter including median intraservice time, reviewing previous notes, investigations, ordering medications, medical decision making, coordinating care and patient verbalized understanding at the end of the visit.     Hoy Register, MD, FAAFP. [facility] [place], [place] [phone]   12/18/2019, 8:45 AM

## 2019-12-20 ENCOUNTER — Ambulatory Visit: Payer: No Typology Code available for payment source | Admitting: Allergy

## 2019-12-20 ENCOUNTER — Encounter: Payer: Self-pay | Admitting: Allergy

## 2019-12-20 ENCOUNTER — Telehealth: Payer: Self-pay

## 2019-12-20 ENCOUNTER — Other Ambulatory Visit: Payer: Self-pay

## 2019-12-20 VITALS — BP 126/78 | HR 88 | Temp 98.7°F | Resp 18 | Ht 60.25 in

## 2019-12-20 DIAGNOSIS — H538 Other visual disturbances: Secondary | ICD-10-CM

## 2019-12-20 DIAGNOSIS — H6983 Other specified disorders of Eustachian tube, bilateral: Secondary | ICD-10-CM

## 2019-12-20 DIAGNOSIS — H109 Unspecified conjunctivitis: Secondary | ICD-10-CM

## 2019-12-20 DIAGNOSIS — J310 Chronic rhinitis: Secondary | ICD-10-CM

## 2019-12-20 MED ORDER — BEPOTASTINE BESILATE 1.5 % OP SOLN
OPHTHALMIC | 5 refills | Status: DC
Start: 2019-12-20 — End: 2020-09-30

## 2019-12-20 NOTE — Patient Instructions (Addendum)
 -   environmental allergy skin testing is negative from 11/29/19  - common food allergy skin testing is negative from 11/29/19  - will obtain environmental allergy panel to see if you have any environmental allergens  - for itchy, watery, or red eyes try Bepreve 1 drop each eye daily twice as needed.  If this doesn't help itchy eyes then recommend seeing an eye doctor.    - you are not getting any benefit from Flonase thus would recommend stopping.  You can use Flonase if you are having nasal congestion.    - try antihistamine either Ryvent, Ryclora or Karbinal and take a dose twice a day.  These are similar to Claritin, Zyrtec, Allegra, Xyzal but require twice a day dosing at minimum.  If these are effective let us know  - will place referrals for ENT for evaluation of eustachian tube dysfunction and optometry referral for ocular pruritus with blurry vision  Follow-up as needed

## 2019-12-20 NOTE — Progress Notes (Signed)
Follow-up Note  RE: Lorriane Dehart MRN: [id number] DOB: Jun 15, 1970 Date of Office Visit: 12/20/2019   History of present illness: Charlisa Cham is a 49 y.o. female presenting today for follow-up of itchy eyes and ears.  She was last seen in the office on 11/29/19 by myself. She presents today with a Engineer, structural. She states her symptoms are no better. She still has itchy eyes and reports having some blurry vision of her right eye. She still reports itchy ears and and clogged sensation of her ears. Flonase has not been effective for her. She is not on any antihistamines at this time. Xyzal was not effective. I did recommend that she take either RyVent, Ryclora or carbinol however she did not do this. She states the olopatadine eyedrops are not helpful either.     Review of systems: Review of Systems  Constitutional: Negative.   HENT:       See HPI  Eyes:       See HPI  Respiratory: Negative.   Cardiovascular: Negative.   Gastrointestinal: Negative.   Musculoskeletal: Negative.   Skin: Negative.   Neurological: Negative.     All other systems negative unless noted above in HPI  Past medical/social/surgical/family history have been reviewed and are unchanged unless specifically indicated below.  No changes  Medication List: Current Outpatient Medications  Medication Sig Dispense Refill  . Bepotastine Besilate (BEPREVE) 1.5 % SOLN 1 drop twice daily as needed for itchy eyes 10 mL 5  . fluticasone (FLONASE) 50 MCG/ACT nasal spray Place 2 sprays into both nostrils daily. (Patient not taking: Reported on 12/20/2019) 16 g 6  . gabapentin (NEURONTIN) 100 MG capsule Take 1 capsule (100 mg total) by mouth 3 (three) times daily. (Patient not taking: Reported on 12/20/2019) 90 capsule 11  . levocetirizine (XYZAL) 5 MG tablet Take 1 tablet (5 mg total) by mouth every evening. (Patient not taking: Reported on 12/20/2019) 90 tablet 3  . traZODone (DESYREL) 50 MG tablet Take 2  tablets (100 mg total) by mouth at bedtime. (Patient not taking: Reported on 12/20/2019) 30 tablet 1   No current facility-administered medications for this visit.     Known medication allergies: No Known Allergies   Physical examination: Blood pressure 126/78, pulse 88, temperature 98.7 F (37.1 C), temperature source Temporal, resp. rate 18, height 5' 0.25" (1.53 m), SpO2 98 %.  General: Alert, interactive, in no acute distress. HEENT: PERRLA, TMs pearly gray, turbinates non-edematous without discharge, post-pharynx non erythematous. Neck: Supple without lymphadenopathy. Lungs: Clear to auscultation without wheezing, rhonchi or rales. {no increased work of breathing. CV: Normal S1, S2 without murmurs. Abdomen: Nondistended, nontender. Skin: Warm and dry, without lesions or rashes. Extremities:  No clubbing, cyanosis or edema. Neuro:   Grossly intact.  Diagnositics/Labs: None today  Assessment and plan: Rhinoconjunctivitis-pruritus of eyes and ears Blurry vision ? Eustachian tube dysfunction    - environmental allergy skin testing is negative from 11/29/19  - common food allergy skin testing is negative from 11/29/19  - will obtain environmental allergy panel to see if you have any environmental allergens  - for itchy, watery, or red eyes try Bepreve 1 drop each eye daily twice as needed.  If this doesn't help itchy eyes then recommend seeing an eye doctor.    - you are not getting any benefit from Flonase thus would recommend stopping.  You can use Flonase if you are having nasal congestion.    - try antihistamine either Ryvent,  Ryclora or Lenor Derrick and take a dose twice a day.  These are similar to Claritin, Zyrtec, Allegra, Xyzal but require twice a day dosing at minimum.  If these are effective let us know  - will place referrals for ENT for evaluation of eustachian tube dysfunction and optometry referral for ocular pruritus with blurry vision  Follow-up as needed  I  appreciate the opportunity to take part in Shirley's care. Please do not hesitate to contact me with questions.  Sincerely,   Margo Aye, MD Allergy/Immunology [facility]

## 2019-12-20 NOTE — Telephone Encounter (Signed)
Ambulatories to ENT for VCD and Optometrist- Blurred vision and itchy eyes.

## 2019-12-23 LAB — ALLERGENS W/TOTAL IGE AREA 2

## 2019-12-24 ENCOUNTER — Telehealth: Payer: Self-pay

## 2019-12-24 NOTE — Telephone Encounter (Signed)
-----   Message from [facility] Larose Hires, MD sent at 12/20/2019  2:33 PM EDT ----- Patient requested referrals for ENT-evaluate for? Eustachian tube dysfunctionAnd for optometry-evaluate for blurred vision with conjunctivitis

## 2019-12-24 NOTE — Telephone Encounter (Signed)
Hey,  So it looks like the patient has [employer].  I called the patients PCP to help with this referral being placed. A note has been sent to the PCP to help get this going.  Patient has been informed this information with the interpreter service via phone call today.  I will keep an eye out on this one.   Thanks

## 2019-12-25 NOTE — Telephone Encounter (Signed)
Please see telephone contact created on 12/24/2019.  Thanks

## 2019-12-25 NOTE — Telephone Encounter (Signed)
Call [facility] to see if they take [facility]. Also Call the provider pcp put in the referral for the optometrists

## 2020-01-02 NOTE — Telephone Encounter (Signed)
Referrals placed to [facility] [phone] &  Dr Wynona Luna (Eye Doctor) [phone]

## 2020-02-12 ENCOUNTER — Institutional Professional Consult (permissible substitution): Payer: No Typology Code available for payment source | Admitting: Neurology

## 2020-02-12 ENCOUNTER — Encounter: Payer: Self-pay | Admitting: Neurology

## 2020-03-27 ENCOUNTER — Other Ambulatory Visit: Payer: Self-pay

## 2020-03-27 ENCOUNTER — Ambulatory Visit: Payer: Self-pay | Attending: Primary Care

## 2020-04-03 ENCOUNTER — Ambulatory Visit (INDEPENDENT_AMBULATORY_CARE_PROVIDER_SITE_OTHER): Payer: No Typology Code available for payment source | Admitting: Primary Care

## 2020-04-04 ENCOUNTER — Other Ambulatory Visit: Payer: Self-pay

## 2020-04-04 ENCOUNTER — Ambulatory Visit (INDEPENDENT_AMBULATORY_CARE_PROVIDER_SITE_OTHER): Payer: Self-pay | Admitting: Primary Care

## 2020-04-04 ENCOUNTER — Encounter (INDEPENDENT_AMBULATORY_CARE_PROVIDER_SITE_OTHER): Payer: Self-pay | Admitting: Primary Care

## 2020-04-04 VITALS — BP 109/71 | HR 71 | Temp 97.5°F | Ht 62.5 in | Wt 123.8 lb

## 2020-04-04 DIAGNOSIS — F338 Other recurrent depressive disorders: Secondary | ICD-10-CM

## 2020-04-04 DIAGNOSIS — E7800 Pure hypercholesterolemia, unspecified: Secondary | ICD-10-CM

## 2020-04-04 NOTE — Progress Notes (Signed)
Established Patient Office Visit  Subjective:  Patient ID: Natalie Waller, female    DOB: 06-14-1970  Age: 49 y.o. MRN: [id number]  CC:  Chief Complaint  Patient presents with  . Palpitations    Tightness in back; pressure and sometimes short of breath   Jriso [id number] HPI Ms. Natalie Waller is a 49 year old Hispanic female who presents for several issues , sweating, hot inside irregular cycles Patient's last menstrual period was 03/23/2020 (exact date). Feels pressure on her body she becomes shortness of breath described feels like desperation.   Past Medical History:  Diagnosis Date  . Environmental and seasonal allergies 01/03/2017  . Headache 2017  . Migraine   . Neck pain 2017  . Tension headache, chronic 04/27/2015    Past Surgical History:  Procedure Laterality Date  . APPENDECTOMY  2009  . CESAREAN SECTION  1997    Family History  Problem Relation Age of Onset  . Hepatitis C Mother     Social History   Socioeconomic History  . Marital status: Married    Spouse name: Natalie Waller  . Number of children: 1  . Years of education: 51  . Highest education level: Not on file  Occupational History  . Occupation: unemployed     Comment: Previously worked as Education officer, environmental in a gym  Tobacco Use  . Smoking status: Never Smoker  . Smokeless tobacco: Never Used  Substance and Sexual Activity  . Alcohol use: No    Alcohol/week: 0.0 standard drinks  . Drug use: No  . Sexual activity: Yes    Birth control/protection: Condom  Other Topics Concern  . Not on file  Social History Narrative   [place]   Married her current husband, Natalie Waller May 2017   Lives at home with her husband   Caffeine use: very rare   Right handed   Social Determinants of Health   Financial Resource Strain: Not on file  Food Insecurity: Not on file  Transportation Needs: Not on file  Physical Activity: Not on file  Stress: Not on file  Social Connections: Not on file  Intimate Partner  Violence: Not on file    Outpatient Medications Prior to Visit  Medication Sig Dispense Refill  . Bepotastine Besilate (BEPREVE) 1.5 % SOLN 1 drop twice daily as needed for itchy eyes 10 mL 5  . gabapentin (NEURONTIN) 100 MG capsule Take 1 capsule (100 mg total) by mouth 3 (three) times daily. (Patient not taking: No sig reported) 90 capsule 11  . traZODone (DESYREL) 50 MG tablet Take 2 tablets (100 mg total) by mouth at bedtime. (Patient not taking: No sig reported) 30 tablet 1  . fluticasone (FLONASE) 50 MCG/ACT nasal spray Place 2 sprays into both nostrils daily. (Patient not taking: Reported on 12/20/2019) 16 g 6  . levocetirizine (XYZAL) 5 MG tablet Take 1 tablet (5 mg total) by mouth every evening. (Patient not taking: Reported on 12/20/2019) 90 tablet 3   No facility-administered medications prior to visit.    Not on File  ROS Review of Systems  Respiratory: Positive for chest tightness and shortness of breath.   Cardiovascular: Positive for chest pain and palpitations.  Psychiatric/Behavioral:       Seasonal anxiety depression disorder   All other systems reviewed and are negative.     Objective:    Physical Exam Vitals reviewed.  Constitutional:      Appearance: Normal appearance. She is normal weight.  HENT:  Head: Normocephalic.  Eyes:     Extraocular Movements: Extraocular movements intact.  Cardiovascular:     Rate and Rhythm: Normal rate and regular rhythm.  Pulmonary:     Effort: Pulmonary effort is normal.     Breath sounds: Normal breath sounds.  Abdominal:     General: Abdomen is flat. Bowel sounds are normal.     Palpations: Abdomen is soft.  Musculoskeletal:        General: Normal range of motion.     Cervical back: Normal range of motion and neck supple.  Skin:    General: Skin is warm and dry.  Neurological:     Mental Status: She is alert and oriented to person, place, and time.  Psychiatric:        Mood and Affect: Mood normal.         Behavior: Behavior normal.        Thought Content: Thought content normal.        Judgment: Judgment normal.     BP 109/71 (BP Location: Right Arm, Patient Position: Sitting, Cuff Size: Normal)   Pulse 71   Temp (!) 97.5 F (36.4 C) (Temporal)   Ht 5' 2.5" (1.588 m)   Wt 123 lb 12.8 oz (56.2 kg)   LMP 03/23/2020 (Exact Date)   SpO2 98%   BMI 22.28 kg/m  Wt Readings from Last 3 Encounters:  04/04/20 123 lb 12.8 oz (56.2 kg)  11/29/19 125 lb (56.7 kg)  11/20/19 128 lb 9.6 oz (58.3 kg)     There are no preventive care reminders to display for this patient.  There are no preventive care reminders to display for this patient.  No results found for: TSH Lab Results  Component Value Date   WBC 6.3 11/23/2019   HGB 13.5 11/23/2019   HCT 40.6 11/23/2019   MCV 93.1 11/23/2019   PLT 265 11/23/2019   Lab Results  Component Value Date   NA 138 11/23/2019   K 3.8 11/23/2019   CO2 27 11/23/2019   GLUCOSE 101 (H) 11/23/2019   BUN <5 (L) 11/23/2019   CREATININE 0.72 11/23/2019   BILITOT 0.4 06/22/2019   ALKPHOS 67 06/22/2019   AST 30 06/22/2019   ALT 36 (H) 06/22/2019   PROT 7.3 06/22/2019   ALBUMIN 4.5 06/22/2019   CALCIUM 9.6 11/23/2019   ANIONGAP 7 11/23/2019   Lab Results  Component Value Date   CHOL 228 (H) 06/22/2019   Lab Results  Component Value Date   HDL 55 06/22/2019   Lab Results  Component Value Date   LDLCALC 149 (H) 06/22/2019   Lab Results  Component Value Date   TRIG 137 06/22/2019   Lab Results  Component Value Date   CHOLHDL 4.1 06/22/2019   No results found for: HGBA1C    Assessment & Plan:  [name] was seen today for palpitations.  Diagnoses and all orders for this visit:  Hypercholesterolemia Lipids   Seasonal depression (HCC) Flowsheet Row Office Visit from 11/20/2019 in [facility]  PHQ-9 Total Score 15     Follow up with CSW. Discussed medication options and after f/u will CSW we can decide if  medication is a option   No orders of the defined types were placed in this encounter.   Follow-up: Return for first available CSW.    [name], NP

## 2020-04-04 NOTE — Patient Instructions (Signed)
Trastorno afectivo estacional Seasonal Affective Disorder El trastorno Programmer, applications (TAE) es un tipo de depresin. La persona se siente deprimida en momentos especficos del ao. El trastorno afectivo emocional se presenta con ms frecuencia a fines del otoo y en el invierno, cuando Cascades son ms cortos y Games developer de las personas pasan menos tiempo al [place]. Es por este motivo que al trastorno [employer] estacional tambin se lo conoce como "melancola invernal". Este trastorno es menos frecuente durante la primavera y el verano. El trastorno Programmer, applications puede ser leve o grave y puede interferir en el trabajo, la escuela, las relaciones y las actividades cotidianas normales. Cules son las causas? Se desconoce la causa de esta afeccin. Es posible que est relacionada con cambios en la qumica del cerebro que se deben a Therapist, art exposicin a la luz del da. Qu incrementa el riesgo? Es ms probable que usted sufra esta afeccin si:  Es mujer.  Vive en el extremo norte o extremo sur del [place]. Estas zonas son las que tienen inviernos ms largos y menos luz del sol.  Tiene antecedentes personales de depresin o trastorno bipolar.  Tiene antecedentes familiares de afecciones de salud mental. Cules son los signos o sntomas? Los sntomas de esta afeccin [employer] siguientes:  nimo deprimido, que puede involucrar lo siguiente: ? Sentirse triste o con ganas de llorar. ? Tener crisis de llanto.  Irritabilidad.  Dificultad para dormir o dormir ms de lo habitual.  Falta de inters en las actividades que se suelen disfrutar.  Tiene sentimientos de culpa o de inutilidad.  Inquietud o falta de energa.  Dificultad para concentrarse, recordar o tomar decisiones.  Cambios significativos en el apetito o en el peso.  Pensar en lastimarse o intentar suicidarse. Los sntomas asociados con el patrn invernal del trastorno afectivo estacional incluyen los  siguientes:  Comer en exceso o tener antojo de comer cosas dulces.  Aumento de [place].  Evitar las Scientist, product/process development (aislamiento social) o sentirse como "hibernando".  Dormir ms que lo habitual. Los sntomas asociados con el patrn estival del trastorno Principal Financial, que es menos frecuente, incluyen los siguientes:  Prdida del apetito.  Prdida de peso.  Dificultad para dormir.  Episodios de comportamiento violento (en los casos graves). Cmo se diagnostica? Esta afeccin se diagnostica generalmente a travs de una evaluacin que realiza el mdico. Le harn preguntas sobre sus estados anmicos, pensamientos y comportamientos. Adems, sobre sus antecedentes mdicos, cualquier cambio importante en su vida, y [employer] Corporation y las sustancias que consume. Es posible Producer, television/film/video un examen fsico y [place] para descartar otras causas posibles de los sntomas. Tal vez lo deriven a Field seismologist mental para que le realice una evaluacin. Cmo se trata? El tratamiento de esta afeccin puede incluir lo siguiente:  Fototerapia. Esta terapia consiste en sentarse frente a una fuente de luz durante 15 a diarios. La fuente de luz puede ser: ? Rosaria Ferries de luz. ? Un simulador de alba o reloj de Copywriter, advertising. Se trata de una fuente de luz activada por un temporizador que imita el amanecer al ponerse poco a poco ms brillante. Puede ayudar a activar el reloj interno de su organismo.  Antidepresivos.  Terapia cognitivo conductual (TCC). Se trata de un tipo de psicoterapia que ayuda a identificar y Northrop [employer]man pensamientos negativos asociados con el trastorno Programmer, applications.  Cambios en los hbitos relacionados con la alimentacin, la actividad fsica y el sueo. Un  estilo de vida saludable puede ayudarle a prevenir o [employer]. Siga estas indicaciones en su casa: Medicamentos  Bax[employer] de venta libre y los recetados  solamente como se lo haya indicado el mdico.  Si toma medicamentos antidepresivos, pregntele a su mdico qu efectos secundarios Education officer, museum.  Consulte al mdico antes de tomar medicamentos recetados o de venta libre, hierbas o suplementos por primera vez. Estilo de vida  Siga una dieta saludable que incluya frutas y verduras, cereales integrales y protenas magras.  Duerma lo suficiente. Para mejorar la forma de dormir, haga lo siguiente: ? Mantenga el dormitorio oscuro y Holiday representative. ? Vyase a dormir y Geophysical data processor a la misma hora todos los [place]. ? No tenga pantallas (como un televisor o telfono inteligente) en su habitacin. Limite la cantidad de tiempo que pasa frente a una pantalla a partir de algunas horas antes de irse a dormir.  Haga ejercicio regularmente.  Limite el consumo de alcohol y cafena como se lo haya indicado el mdico. Instrucciones generales  Haga que su casa y su ambiente laboral estn tan soleados o iluminados como sea posible. Abra las persianas y [employer] muebles ms cerca de las ventanas.  Pase todo el tiempo que pueda al [employer].  Use la fototerapia durante 15 a 30 minutos todos los [address], o con la frecuencia que le hayan indicado.  Concurra a las sesiones de terapia cognitiva conductual segn las indicaciones.  Concurra a todas las visitas de [address] se lo hayan indicado el mdico y Training and development officer. Esto es importante. Comunquese con un mdico si:  Los sntomas no mejoran o empeoran.  Tiene dificultad para cuidar de s mismo.  Consume drogas o alcohol para sobrellevar sus sntomas.  Los [employer] causan efectos secundarios. Solicite ayuda de inmediato si:  Tiene pensamientos acerca de lastimarse a usted mismo o a Economist. Si alguna vez siente que puede lastimarse a usted mismo o a Economist, o tiene pensamientos de poner fin a su vida, busque ayuda de inmediato. Puede dirigirse al servicio de  emergencias ms cercano o comunicarse con:  El servicio de emergencias de su localidad (911 en [place]  Una lnea de asistencia al suicida y Visual merchandiser en crisis, como la [employer] de Prevencin del Suicidio (National Suicide Prevention Lifeline), al ([phone]. Est disponible las 24 horas del da. Resumen  El trastorno Programmer, applications (TAE) es un tipo de depresin asociado con momentos especficos del ao (generalmente, el otoo y el invierno).  Esta afeccin puede tratarse con fototerapia, psicoterapia y medicamentos antidepresivos.  Para ayudar a mejorar su afeccin, cuide bien de s mismo y haga que su casa y su lugar de trabajo estn tan soleados e iluminados como sea posible.  Busque ayuda de inmediato si tiene pensamientos acerca de Runner, broadcasting/film/video o de Physicist, medical a Economist. Esta informacin no tiene Theme park manager el consejo del mdico. Asegrese de hacerle al mdico cualquier pregunta que tenga. Document Revised: 03/30/2017 Document Reviewed: 03/30/2017 Elsevier Patient Education  2020 [employer].

## 2020-04-05 LAB — CMP14+EGFR
ALT: 16 IU/L (ref 0–32)
AST: 20 IU/L (ref 0–40)
Albumin/Globulin Ratio: 1.9 (ref 1.2–2.2)
Albumin: 4.9 g/dL — ABNORMAL HIGH (ref 3.8–4.8)
Alkaline Phosphatase: 70 IU/L (ref 44–121)
BUN/Creatinine Ratio: 17 (ref 9–23)
BUN: 12 mg/dL (ref 6–24)
Bilirubin Total: 0.5 mg/dL (ref 0.0–1.2)
CO2: 22 mmol/L (ref 20–29)
Calcium: 9.7 mg/dL (ref 8.7–10.2)
Chloride: 103 mmol/L (ref 96–106)
Creatinine, Ser: 0.71 mg/dL (ref 0.57–1.00)
GFR calc Af Amer: 116 mL/min/{1.73_m2} (ref >59)
GFR calc non Af Amer: 100 mL/min/{1.73_m2} (ref >59)
Globulin, Total: 2.6 g/dL (ref 1.5–4.5)
Glucose: 90 mg/dL (ref 65–99)
Potassium: 4.5 mmol/L (ref 3.5–5.2)
Sodium: 138 mmol/L (ref 134–144)
Total Protein: 7.5 g/dL (ref 6.0–8.5)

## 2020-04-05 LAB — LIPID PANEL
Chol/HDL Ratio: 3.5 ratio (ref 0.0–4.4)
Cholesterol, Total: 196 mg/dL (ref 100–199)
HDL: 56 mg/dL (ref >39)
LDL Chol Calc (NIH): 125 mg/dL — ABNORMAL HIGH (ref 0–99)
Triglycerides: 85 mg/dL (ref 0–149)
VLDL Cholesterol Cal: 15 mg/dL (ref 5–40)

## 2020-04-11 ENCOUNTER — Telehealth (INDEPENDENT_AMBULATORY_CARE_PROVIDER_SITE_OTHER): Payer: Self-pay

## 2020-04-11 NOTE — Telephone Encounter (Signed)
Call placed to patient with assistance of pacific interpreter [phone]) patient verified date of birth. She is aware that labs are normal except elevated cholesterol which can lead to stroke and heart attack. Advised patient on dietary changes. She states she does not consume any of those foods. Advised her to increase fiber with whole grains and veggies. Will recheck in six months. She verbalized understanding of results. Natalie Waller, CMA

## 2020-04-11 NOTE — Telephone Encounter (Signed)
-----   Message from [name], NP sent at 04/07/2020  2:42 PM EST ----- Your LDL is not in range. Your LDL is the bad cholesterol that can lead to heart attack and stroke. To lower your number you can decrease your fatty foods, red meat, cheese, milk and increase fiber like whole grains and veggies. You can also add a fiber supplement like Metamucil or Benefiber.

## 2020-04-15 ENCOUNTER — Ambulatory Visit (INDEPENDENT_AMBULATORY_CARE_PROVIDER_SITE_OTHER): Payer: No Typology Code available for payment source | Admitting: Licensed Clinical Social Worker

## 2020-04-15 ENCOUNTER — Other Ambulatory Visit: Payer: Self-pay

## 2020-04-15 DIAGNOSIS — E7800 Pure hypercholesterolemia, unspecified: Secondary | ICD-10-CM

## 2020-04-15 NOTE — Patient Instructions (Signed)
Colesterol elevado High Cholesterol  El colesterol elevado es una afeccin en la que la sangre tiene niveles altos de una sustancia blanca, cerosa y parecida a la grasa (colesterol). El organismo humano necesita una pequea cantidad de colesterol. El hgado fabrica todo el colesterol que el organismo necesita. El exceso de colesterol proviene de los alimentos que comemos. La sangre transporta el colesterol desde el hgado a travs de los vasos sanguneos. Si tiene el colesterol elevado, este puede depositarse (formar placas) en las paredes de los vasos sanguneos (arterias). Las placas provocan el estrechamiento y la rigidez de las arterias. Las placas de colesterol aumentan el riesgo de sufrir un infarto de miocardio y un accidente cerebrovascular. Trabaje con el mdico para mantener las concentraciones de colesterol en un rango saludable. Qu incrementa el riesgo? Es ms probable que esta afeccin se manifieste en las personas que:  Consumen alimentos con alto contenido de grasa animal (grasa saturada) o colesterol.  Tienen sobrepeso.  No hacen suficiente ejercicio fsico.  Tienen antecedentes familiares de colesterol elevado. Cules son los signos o los sntomas? Esta afeccin no presenta sntomas. Cmo se diagnostica? Esta afeccin podra diagnosticarse a partir de los resultados de anlisis de sangre.  Si es mayor de 20aos, es posible que el mdico le controle el colesterol cada 4a6aos.  Los controles pueden ser ms frecuentes si ya tuvo el colesterol elevado u otros factores de riesgo de enfermedades cardacas. En el anlisis de sangre de colesterol, se determina lo siguiente:  El colesterol "malo" (colesterol LDL). Este es el principal tipo de colesterol que causa enfermedades cardacas. El nivel recomendado de LDL es de menos de100.  El colesterol "bueno" (colesterol HDL). Este tipo ayuda a proteger contra las enfermedades cardacas limpiando las arterias y arrastrando el  LDL. El nivel recomendado de HDL es de60 o superior.  Triglicridos. Estos son grasas que el organismo puede almacenar o quemar como fuente de energa. El nivel recomendado de triglicridos es de menos de 150.  Colesterol total. Esta es una medicin de la cantidad total de colesterol en la sangre, que incluye el colesterol LDL, el colesterol HDL y los triglicridos. El valor saludable es de menos de200. Cmo se trata? Esta afeccin se trata con cambios en la dieta y en el estilo de vida, y con medicamentos. Cambios en la dieta  Entre ellos, la ingesta de una mayor cantidad de cereales integrales, frutas, verduras, frutos secos y pescado.  Tambin podran incluir la reduccin del consumo de carnes rojas y alimentos con mucho azcar agregado. Cambios en el estilo de vida  Entre ellos, realizar sesiones de ejercicios aerbicos durante, por lo menos, 40minutos, 3veces por semana. Por ejemplo, caminar, andar en bicicleta y nadar. Los ejercicios aerbicos junto con una dieta sana pueden ayudar a que se mantenga en un peso saludable.  Los cambios tambin podran incluir dejar de fumar. Medicamentos  Por lo general, se administran medicamentos si con los cambios en la alimentacin y en el estilo de vida no se logra reducir el colesterol hasta niveles saludables.  El mdico podra recetarle estatinas. Se ha demostrado que las estatinas disminuyen los niveles de colesterol, lo que puede reducir el riesgo de padecer una enfermedad cardaca. Siga estas indicaciones en su casa: Comida y bebida Si se lo indic el mdico:  Coma pollo (sin piel), pescado, ternera, mariscos, pechuga de pavo molida y cortes de carne roja de pulpa o de lomo.  No coma alimentos fritos ni carnes grasosas, como salchichas y salame.  Coma muchas   frutas, como manzanas.  Coma gran cantidad de verduras, como brcoli, papas y zanahorias.  Coma porotos, guisantes secos y lentejas.  Coma cereales, como cebada, arroz,  cuscs y trigo burgol.  Coma pastas sin salsas con crema.  Tome leche descremada o semidescremada, y coma yogures y quesos descremados o semidescremados.  No coma ni beba leche entera, crema, helado, yemas de huevo ni quesos duros.  No coma margarinas en barra ni untables que contengan grasas trans (que tambin se conocen como aceites parcialmente hidrogenados).  No coma aceites tropicales saturados, como el de coco y el de palma.  No coma tortas, galletas, galletitas ni otros productos horneados que contengan grasas trans.  Instrucciones generales  Haga ejercicio segn las indicaciones del mdico. Aumente la cantidad de ejercicio fsico que realiza mediante actividades como jardinera, salir a caminar o usar las escaleras.  Tome los medicamentos de venta libre y los recetados solamente como se lo haya indicado el mdico.  No consuma ningn producto que contenga nicotina o tabaco, como cigarrillos y cigarrillos electrnicos. Si necesita ayuda para dejar de fumar, consulte al mdico.  Concurra a todas las visitas de control como se lo haya indicado el mdico. Esto es importante. Comunquese con un mdico si:  Tiene dificultad para seguir una dieta sana o mantener un peso saludable.  Necesita ayuda para comenzar un programa de ejercicios.  Necesita ayuda para dejar de fumar. Solicite ayuda de inmediato si:  Siente dolor en el pecho.  Tiene dificultad para respirar. Esta informacin no tiene como fin reemplazar el consejo del mdico. Asegrese de hacerle al mdico cualquier pregunta que tenga. Document Revised: 07/19/2016 Document Reviewed: 10/11/2015 Elsevier Patient Education  2020 Elsevier Inc.  

## 2020-04-15 NOTE — BH Specialist Note (Signed)
LCSW introduced self and [facility]. Pt was informed of [facility] referral to assist with behavioral health and/or resource needs.   Pt reports no hx of depression and anxiety. States that she received information regarding recent lab results and has questions about her condition. Validation and encouragement was provided.   CMA Su Hilt printed out patient's recent lab results and explained them to patient. Opportunity for questions was provided. Pt had no additional concerns.

## 2020-09-30 ENCOUNTER — Ambulatory Visit (INDEPENDENT_AMBULATORY_CARE_PROVIDER_SITE_OTHER): Payer: Self-pay | Admitting: Primary Care

## 2020-09-30 ENCOUNTER — Encounter (INDEPENDENT_AMBULATORY_CARE_PROVIDER_SITE_OTHER): Payer: Self-pay | Admitting: Primary Care

## 2020-09-30 ENCOUNTER — Other Ambulatory Visit: Payer: Self-pay

## 2020-09-30 VITALS — BP 112/70 | HR 66 | Temp 97.5°F | Ht 62.5 in | Wt 125.0 lb

## 2020-09-30 DIAGNOSIS — M25532 Pain in left wrist: Secondary | ICD-10-CM

## 2020-09-30 NOTE — Progress Notes (Signed)
Acute Office Visit  Subjective:    Patient ID: Natalie Waller, female    DOB: 15-Oct-1970, 50 y.o.   MRN: [id number]  Chief Complaint  Patient presents with  . Pain    Left hand     HPI Ms. 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  She is having difficulty with her left hand lifting, grasping and picking up things.  This is also causing decrease in ADLs, i.e.  brushing hair and teeth , holding objects etc.   Past Medical History:  Diagnosis Date  . Environmental and seasonal allergies 01/03/2017  . Headache 2017  . Migraine   . Neck pain 2017  . Tension headache, chronic 04/27/2015    Past Surgical History:  Procedure Laterality Date  . APPENDECTOMY  2009  . CESAREAN SECTION  1997    Family History  Problem Relation Age of Onset  . Hepatitis C Mother     Social History   Socioeconomic History  . Marital status: Married    Spouse name: Alecia Lemming  . Number of children: 1  . Years of education: 80  . Highest education level: Not on file  Occupational History  . Occupation: unemployed     Comment: Previously worked as Education officer, environmental in a gym  Tobacco Use  . Smoking status: Never Smoker  . Smokeless tobacco: Never Used  Substance and Sexual Activity  . Alcohol use: No    Alcohol/week: 0.0 standard drinks  . Drug use: No  . Sexual activity: Yes    Birth control/protection: Condom  Other Topics Concern  . Not on file  Social History Narrative   [place]   Married her current husband, Alecia Lemming May 2017   Lives at home with her husband   Caffeine use: very rare   Right handed   Social Determinants of Health   Financial Resource Strain: Not on file  Food Insecurity: Not on file  Transportation Needs: Not on file  Physical Activity: Not on file  Stress: Not on file  Social  Connections: Not on file  Intimate Partner Violence: Not on file    Outpatient Medications Prior to Visit  Medication Sig Dispense Refill  . Bepotastine Besilate (BEPREVE) 1.5 % SOLN 1 drop twice daily as needed for itchy eyes (Patient not taking: Reported on 09/30/2020) 10 mL 5  . gabapentin (NEURONTIN) 100 MG capsule Take 1 capsule (100 mg total) by mouth 3 (three) times daily. (Patient not taking: No sig reported) 90 capsule 11  . traZODone (DESYREL) 50 MG tablet Take 2 tablets (100 mg total) by mouth at bedtime. (Patient not taking: No sig reported) 30 tablet 1   No facility-administered medications prior to visit.    No Known Allergies  Review of Systems  Musculoskeletal:       Left wrist pain   All other systems reviewed and are negative.      Objective:    Physical Exam Vitals reviewed.  Constitutional:      Appearance: Normal appearance.  HENT:     Head: Normocephalic.     Right Ear: Tympanic membrane and external ear normal.     Left Ear: Tympanic membrane and external ear normal.     Nose: Nose normal.  Eyes:     Extraocular Movements: Extraocular movements intact.     Pupils:  Pupils are equal, round, and reactive to light.  Cardiovascular:     Rate and Rhythm: Normal rate and regular rhythm.  Pulmonary:     Effort: Pulmonary effort is normal.     Breath sounds: Normal breath sounds.  Abdominal:     General: Abdomen is flat. Bowel sounds are normal.     Palpations: Abdomen is soft.  Musculoskeletal:        General: Normal range of motion.     Cervical back: Normal range of motion.  Skin:    General: Skin is warm and dry.  Neurological:     Mental Status: She is alert and oriented to person, place, and time.  Psychiatric:        Mood and Affect: Mood normal.        Behavior: Behavior normal.        Thought Content: Thought content normal.        Judgment: Judgment normal.     BP 112/70 (BP Location: Right Arm, Patient Position: Sitting, Cuff Size:  Normal)   Pulse 66   Temp (!) 97.5 F (36.4 C) (Temporal)   Ht 5' 2.5" (1.588 m)   Wt 125 lb (56.7 kg)   LMP 09/06/2020 (Exact Date)   SpO2 99%   BMI 22.50 kg/m  Wt Readings from Last 3 Encounters:  09/30/20 125 lb (56.7 kg)  04/04/20 123 lb 12.8 oz (56.2 kg)  11/29/19 125 lb (56.7 kg)    Health Maintenance Due  Topic Date Due  . COLONOSCOPY (Pts 45-60yrs Insurance coverage will need to be confirmed)  Never done  . MAMMOGRAM  Never done  . Zoster Vaccines- Shingrix (1 of 2) Never done    There are no preventive care reminders to display for this patient.   No results found for: TSH Lab Results  Component Value Date   WBC 6.3 11/23/2019   HGB 13.5 11/23/2019   HCT 40.6 11/23/2019   MCV 93.1 11/23/2019   PLT 265 11/23/2019   Lab Results  Component Value Date   NA 138 04/04/2020   K 4.5 04/04/2020   CO2 22 04/04/2020   GLUCOSE 90 04/04/2020   BUN 12 04/04/2020   CREATININE 0.71 04/04/2020   BILITOT 0.5 04/04/2020   ALKPHOS 70 04/04/2020   AST 20 04/04/2020   ALT 16 04/04/2020   PROT 7.5 04/04/2020   ALBUMIN 4.9 (H) 04/04/2020   CALCIUM 9.7 04/04/2020   ANIONGAP 7 11/23/2019   Lab Results  Component Value Date   CHOL 196 04/04/2020   Lab Results  Component Value Date   HDL 56 04/04/2020   Lab Results  Component Value Date   LDLCALC 125 (H) 04/04/2020   Lab Results  Component Value Date   TRIG 85 04/04/2020   Lab Results  Component Value Date   CHOLHDL 3.5 04/04/2020   No results found for: HGBA1C     Assessment & Plan:  Roma was seen today for pain.  Diagnoses and all orders for this visit:  Wrist pain, left Does not want NSAID for the pain. Left wrist pain negative for Tinel's test bilateral but positive Phalen's test on the left wrist. -     Ambulatory referral to Orthopedic Surgery   No orders of the defined types were placed in this encounter.    [name], NP

## 2020-09-30 NOTE — Patient Instructions (Signed)
Dolor en la [employer] adultos Wrist Pain, Adult Muchos factores pueden causar dolor en la [place]. Algunas causas frecuentes son las siguientes:  Una lesin [employer].  Un uso excesivo de Nurse, learning disability.  Una afeccin que causa mucha presin en un nervio de la mueca (sndrome del tnel carpiano).  El desgaste normal de las articulaciones que ocurre con la edad (artrosis).  Una afeccin que causa hinchazn, dolor y rigidez en las articulaciones (artritis). A veces, la causa del dolor en la mueca no se conoce. El dolor suele desaparecer cuando se siguen las instrucciones del mdico para Veterinary surgeon. Esto puede incluir Scientist, water quality, Scientific laboratory technician hielo en la [employer] o usar una frula o un vendaje elstico durante un corto [place]. Si el dolor de [place] no desaparece, es Merchant navy officer al mdico. Siga estas instrucciones en su casa: Si tiene una frula o un vendaje elstico:  Use la frula como se lo haya indicado el mdico. Qutesela solamente como se lo haya indicado el mdico. Pregunte si puede quitrsela para baarse.  Afloje la frula o el vendaje si los dedos: ? Hormiguean. ? Se adormecen. ? Se tornan fros y de [employer].  Controle todos los [employer] piel alrededor de la frula o el vendaje. Informe al mdico si tiene alguna inquietud.  Mantenga la frula o el vendaje limpios.  Si la frula o el vendaje no son impermeables: ? No deje que se mojen. ? Cbralos con un envoltorio hermtico cuando tome un bao de inmersin o una ducha. Control del dolor, la rigidez y la hinchazn  Si se lo indican, aplique hielo sobre la zona dolorida. Para hacer esto: ? Si la frula o el vendaje son extrables, quteselos como se lo haya indicado el mdico. ? Ponga el hielo en una bolsa plstica. ? Coloque una [employer] piel y la bolsa de hielo, o entre la frula o el vendaje y la bolsa de hielo. ? Aplique el hielo durante , 2 o 3veces por da.  Mueva los  dedos con frecuencia.  Cuando est sentado o acostado, alce (eleve) la zona de l lesin por encima del nivel del corazn.   Actividad  Deje reposar la [employer] se lo haya indicado el mdico.  Retome sus actividades normales segn lo indicado por el mdico. Pregntele al mdico qu actividades son seguras para usted.  Consulte al mdico cundo puede volver a conducir si tiene una frula o un vendaje en la [place].  Haga los ejercicios como se lo haya indicado el mdico. Instrucciones generales  Est atento a cualquier cambio en los sntomas.  Use los medicamentos de venta libre y los recetados solamente como se lo haya indicado el mdico.  Oceanographer a todas las visitas de seguimiento como se lo haya indicado el mdico. Esto es importante. Comunquese con un mdico si:  Tiene un dolor agudo repentino en la [place], la mano o el brazo que es diferente o [place].  La hinchazn o los moretones en la mueca o la mano [place].  La piel: ? Se pone colorado. ? Muestra una erupcin. ? Muestra llagas abiertas.  El dolor no mejora.  El dolor [place].  Tiene fiebre o escalofros. Solicite ayuda de inmediato si:  Pierde la sensibilidad en la mano o en los dedos de la [place].  Los dedos se tornan de color blanco, estn muy enrojecidos o estn fros y azulados.  No puede mover los dedos. Resumen  Muchos factores pueden Programmer, multimedia en  la [employer].  Si el dolor de [place] no desaparece, es Merchant navy officer al mdico.  Es posible que deba usar una frula o un vendaje durante un perodo corto de [place].  Retome sus actividades normales segn lo indicado por el mdico. Pregntele al mdico qu actividades son seguras para usted. Esta informacin no tiene Theme park manager el consejo del mdico. Asegrese de hacerle al mdico cualquier pregunta que tenga. Document Revised: 05/18/2019 Document Reviewed: 05/18/2019 Elsevier Patient Education  2021 [employer].

## 2020-09-30 NOTE — Progress Notes (Signed)
Feels pain whenever she grabs something

## 2020-10-02 ENCOUNTER — Other Ambulatory Visit: Payer: Self-pay

## 2020-10-02 ENCOUNTER — Ambulatory Visit: Payer: Self-pay | Attending: Primary Care

## 2022-12-01 ENCOUNTER — Ambulatory Visit: Payer: Self-pay | Admitting: Student

## 2022-12-01 VITALS — BP 118/64 | HR 74 | Temp 97.9°F | Ht 61.02 in | Wt 124.5 lb

## 2022-12-01 DIAGNOSIS — H579 Unspecified disorder of eye and adnexa: Secondary | ICD-10-CM

## 2022-12-01 DIAGNOSIS — G43009 Migraine without aura, not intractable, without status migrainosus: Secondary | ICD-10-CM

## 2022-12-01 DIAGNOSIS — E7800 Pure hypercholesterolemia, unspecified: Secondary | ICD-10-CM

## 2022-12-01 DIAGNOSIS — Z131 Encounter for screening for diabetes mellitus: Secondary | ICD-10-CM

## 2022-12-01 DIAGNOSIS — M542 Cervicalgia: Secondary | ICD-10-CM

## 2022-12-01 DIAGNOSIS — Z139 Encounter for screening, unspecified: Secondary | ICD-10-CM

## 2022-12-01 DIAGNOSIS — Z13220 Encounter for screening for lipoid disorders: Secondary | ICD-10-CM

## 2022-12-01 LAB — GLUCOSE, CAPILLARY: Glucose-Capillary: 94 mg/dL (ref 70–99)

## 2022-12-01 LAB — POCT GLYCOSYLATED HEMOGLOBIN (HGB A1C): Hemoglobin A1C: 5.3 % (ref 4.0–5.6)

## 2022-12-01 NOTE — Patient Instructions (Signed)
[name] Comment , gracias por permitirnos ayudarle hoy. Durante esta visita hemos hablado acerca de los siguientes asuntos medicos:   -Ojos: mira las fotos al final de este panfleto. Unas son durante el dia, las puedes usar 3 veces al dia. Y las de la noche antes de dormir. Si esto no le funciona en un mes, le podemos referir al especialista de ojos -Cuello: puedes tomar ibuprofeno 400 mg cada 6 horas y los dias que hagas mas ejercicio, puedes tomar acetaminofen 1000 mg cada 8 horas  -Hoy tambien le hemos hecho examenes para saber si tiene problemas de diabetes or de colesterol. -Le di informacion acerca de el examen de papanicolao para el cancer cervical  y mamografias gratis en la comunidad  No se olvide de terminar el papeleo de la tarjeta naranja   He ordenado los siguientes exmenes de laboratorio:   Lab Orders         Lipid Profile         POC Hbg A1C         Cita de seguimiento: solo si los sintomas continuan  Para contactarnos: Esperamos verte la prxima vez. Llame a nuestra clnica al telefono: [phone] si tiene alguna pregunta o inquietud. El mejor horario para llamar es de lunes a viernes de 9 a. m. a 4 p. m. Si es fuera del horario de atencin o durante el fin de [place], llame al nmero principal del hospital y pregunte por el residente de guardia de medicina interna. Si necesita reposicin de medicamentos, por favor notifique a su farmacia con una semana de anticipacin y ellos nos enviarn una solicitud.  Gracias por confiar en nosotros para cuidar de usted!   Morene Crocker, MD [facility]  En la noche   Durante el dia

## 2022-12-01 NOTE — Progress Notes (Signed)
CC: establish care  HPI:  [name] is a 52 y.o.  with prior medical history stated below presents to our clinic to establish care. Today we focused this visit on her concerns about longstanding eye itchiness R>L and chronic neck pain. We also addressed social determinants of health such as orange card application and diabetes and lipid screening. Please see the assessment and plan section for details.  Past Medical History:  Diagnosis Date   Environmental and seasonal allergies 01/03/2017   Headache 2017   Migraine    Neck pain 2017   Tension headache, chronic 04/27/2015   Review of Systems:   Review of Systems  Constitutional: Negative.   HENT: Negative.    Eyes:  Negative for blurred vision, double vision, photophobia, pain, discharge and redness.  Respiratory: Negative.    Cardiovascular: Negative.   Gastrointestinal: Negative.   Genitourinary: Negative.   Musculoskeletal:  Positive for neck pain. Negative for back pain, falls, joint pain and myalgias.  Skin:  Positive for itching.       Right eye  Neurological:  Negative for dizziness, tingling, tremors, sensory change, speech change, focal weakness, seizures, loss of consciousness, weakness and headaches.  Endo/Heme/Allergies: Negative.   Psychiatric/Behavioral: Negative.       Physical Exam:  Vitals:   12/01/22 1016  BP: 118/64  Pulse: 74  Temp: 97.9 F (36.6 C)  TempSrc: Oral  SpO2: 99%  Weight: 124 lb 8 oz (56.5 kg)  Height: 5' 1.02" (1.55 m)    General: Pleasant, well-appearing woman laying in exam bed. No acute distress. Head: Normocephalic. Atraumatic. Eye: Eyelids without erythema, or excoriation,  tenderness to light touch. Tear film present bilaterally. No inflammation on lash line. Normal closure of bilateral eyelid. No conjunctival injection or secretion. Pupils are equal and reactive to light bilaterally. CV: RRR. No murmurs, rubs, or gallops. No LE edema Pulmonary: Lungs CTAB. Normal  effort. No wheezing or rales. Abdominal: Soft, nontender, nondistended. Normal bowel sounds. Extremities: Palpable radial and DP pulses. Normal ROM; tenderness to palpation along levator scapulae and trapezius muscle. No step off deformities along spine or point tenderness.  Skin: Warm and dry. No obvious rash or lesions. Neuro: A&Ox3. Negative Spurling test. No changes in vision with movement of neck muscle. Moves all extremities. Normal sensation. No focal deficit. Psych: Normal mood and affect  Assessment & Plan:   See Encounters Tab for problem based charting.  Neck pain Chronic issue for over 8 years. No recent trauma. Patient has been seen for this issue in the past. She denies prior fractures, loss of conciousness, altered mentation, or focal deficits. No radicular signs. No changes in her vision other than the pruritus addressed below. Currently the pain in 3-4/10. It is located from the base of the neck and spans the trapezius muscle. It is worse in the morning, especially after lifting weights, which the patient does often. Does not always stretch after exercising. Natural remedies (unable to tell me what is in them) has been keeping pain controlled. Has tried some tylenol in the past but non recently. She is able to perform ADLs and iADLS without major difficulty.  No red flags on physical exam. No radiculopathy noted. Full ROM without pain. Tense, tender levator scapulae/trapezious muscles on examination. No gait instability. No weakness on exam. Negative neck pain with flexion.  Discussed with patient the effect of heavy weight lifting can have on neck muscles; correct form and stretching needed. Would like to try supportive treatment management  with tylenol 1000 mg q8HR. Provided stretching exercises to try for the next 4-6 weeks -if unsuccessful, would order CX cervical to evaluate for spinal canal stenosis or degenerative changes of the spine   Migraine No recent  migraines  Itch of right eye Chronic issue. Patient has been evaluated prior to this visit for same concern. She reports she has tried antihistamine oral and topical medications. Has been seen by allergist and has been compliant with therapy without improvement. She continues to feel as though there is something stuck in her eye and there is intermittent pruritus that does not go away. Patient has not tried lubricant eye drops or ointments at night.   Eye: Eyelids without erythema, or excoriation,  tenderness to light touch. Tear film present bilaterally. No inflammation on lash line. Normal closure of bilateral eyelid. No conjunctival injection or secretion. Pupils are equal and reactive to light bilaterally.  Discussed the potential for this to be a presentation of dry eye. No acute conjunctival signs of infection or inflammation. Of note, mucus membranes are moist and without signs of ulcer, or skin findings suggestive of  behcet's syndrome.   After shared decision making, the patient amenable to try lubricant eye drops in the AM and ointment at night. If successful, patient will continue treatment. If unsuccessful, we will refer to ophthalmology for further testing and management   Hypercholesterolemia Lipid panel today The 10-year ASCVD risk score (Arnett DK, et al., 2019) is: 1.1%   Values used to calculate the score:     Age: 43 years     Sex: Female     Is Non-Hispanic African American: No     Diabetic: No     Tobacco smoker: No     Systolic Blood Pressure: 118 mmHg     Is BP treated: No     HDL Cholesterol: 58 mg/dL     Total Cholesterol: 191 mg/dL  - Will continue monitoring every 3-5 years  Screening for diabetes mellitus Screening diabetes exam during this visit: Lab Results  Component Value Date   HGBA1C 5.3 12/01/2022     Eron was seen today for establish care and neck pain.  Diagnoses and all orders for this visit:  Screening for diabetes mellitus -     POC  Hbg A1C  Screening for lipid disorders -     Lipid Profile  Neck pain  Migraine without aura and without status migrainosus, not intractable  Itch of right eye  Hypercholesterolemia  Other orders -     Glucose, capillary    Return if symptoms worsen or fail to improve.   Patient discussed with Dr. Gardiner Ramus, MD [facility] - PGY-1 12/03/2022, 8:25 AM

## 2022-12-03 DIAGNOSIS — Z139 Encounter for screening, unspecified: Secondary | ICD-10-CM | POA: Insufficient documentation

## 2022-12-03 DIAGNOSIS — H579 Unspecified disorder of eye and adnexa: Secondary | ICD-10-CM | POA: Insufficient documentation

## 2022-12-03 DIAGNOSIS — Z131 Encounter for screening for diabetes mellitus: Secondary | ICD-10-CM | POA: Insufficient documentation

## 2022-12-03 NOTE — Assessment & Plan Note (Signed)
Chronic issue. Patient has been evaluated prior to this visit for same concern. She reports she has tried antihistamine oral and topical medications. Has been seen by allergist and has been compliant with therapy without improvement. She continues to feel as though there is something stuck in her eye and there is intermittent pruritus that does not go away. Patient has not tried lubricant eye drops or ointments at night.   Eye: Eyelids without erythema, or excoriation,  tenderness to light touch. Tear film present bilaterally. No inflammation on lash line. Normal closure of bilateral eyelid. No conjunctival injection or secretion. Pupils are equal and reactive to light bilaterally.  Discussed the potential for this to be a presentation of dry eye. No acute conjunctival signs of infection or inflammation. Of note, mucus membranes are moist and without signs of ulcer, or skin findings suggestive of  behcet's syndrome.   After shared decision making, the patient amenable to try lubricant eye drops in the AM and ointment at night. If successful, patient will continue treatment. If unsuccessful, we will refer to ophthalmology for further testing and management

## 2022-12-03 NOTE — Assessment & Plan Note (Signed)
[employer] paperwork provided at this OV

## 2022-12-03 NOTE — Assessment & Plan Note (Signed)
Chronic issue for over 8 years. No recent trauma. Patient has been seen for this issue in the past. She denies prior fractures, loss of conciousness, altered mentation, or focal deficits. No radicular signs. No changes in her vision other than the pruritus addressed below. Currently the pain in 3-4/10. It is located from the base of the neck and spans the trapezius muscle. It is worse in the morning, especially after lifting weights, which the patient does often. Does not always stretch after exercising. Natural remedies (unable to tell me what is in them) has been keeping pain controlled. Has tried some tylenol in the past but non recently. She is able to perform ADLs and iADLS without major difficulty.  No red flags on physical exam. No radiculopathy noted. Full ROM without pain. Tense, tender levator scapulae/trapezious muscles on examination. No gait instability. No weakness on exam. Negative neck pain with flexion.  Discussed with patient the effect of heavy weight lifting can have on neck muscles; correct form and stretching needed. Would like to try supportive treatment management with tylenol 1000 mg q8HR. Provided stretching exercises to try for the next 4-6 weeks -if unsuccessful, would order CX cervical to evaluate for spinal canal stenosis or degenerative changes of the spine

## 2022-12-03 NOTE — Progress Notes (Signed)
[facility] Medicine Clinic Attending  Case discussed with Dr. Gomez-Caraballo  At the time of the visit.  We reviewed the resident's history and exam and pertinent patient test results.  I agree with the assessment, diagnosis, and plan of care documented in the resident's note.  

## 2022-12-03 NOTE — Assessment & Plan Note (Signed)
Screening diabetes exam during this visit: Lab Results  Component Value Date   HGBA1C 5.3 12/01/2022

## 2022-12-03 NOTE — Assessment & Plan Note (Signed)
No recent migraines.  

## 2022-12-03 NOTE — Assessment & Plan Note (Signed)
Lipid panel today The 10-year ASCVD risk score (Arnett DK, et al., 2019) is: 1.1%   Values used to calculate the score:     Age: 52 years     Sex: Female     Is Non-Hispanic African American: No     Diabetic: No     Tobacco smoker: No     Systolic Blood Pressure: 118 mmHg     Is BP treated: No     HDL Cholesterol: 58 mg/dL     Total Cholesterol: 191 mg/dL  - Will continue monitoring every 3-5 years

## 2024-04-09 ENCOUNTER — Ambulatory Visit (INDEPENDENT_AMBULATORY_CARE_PROVIDER_SITE_OTHER): Payer: Self-pay | Admitting: Orthopedic Surgery

## 2024-04-09 ENCOUNTER — Other Ambulatory Visit: Payer: Self-pay

## 2024-04-09 ENCOUNTER — Encounter: Payer: Self-pay | Admitting: Orthopedic Surgery

## 2024-04-09 VITALS — BP 124/72 | HR 76 | Ht 60.0 in | Wt 130.0 lb

## 2024-04-09 DIAGNOSIS — M542 Cervicalgia: Secondary | ICD-10-CM

## 2024-04-11 NOTE — Progress Notes (Signed)
 Orthopedic Spine Surgery Office Note  Assessment: Patient is a 53 y.o. female with neck pain and occipital pain.  No radicular pain   Plan: -Explained that initially conservative treatment is tried as a significant number of patients may experience relief with these treatment modalities. Discussed that the conservative treatments include:  -activity modification  -physical therapy  -over the counter pain medications  -medrol dosepak  -cervical steroid injections -Patient has tried PT, Tylenol , ibuprofen -Since patient has had 9 years of pain without any improvement, recommended an MRI of the cervical spine to evaluate further -Can use Tylenol  up to 1000 mg 3 times daily -Patient should return to office in 4 weeks, x-rays at next visit: none   Patient expressed understanding of the plan and all questions were answered to the patient's satisfaction.   ___________________________________________________________________________   History:  Patient is a 53 y.o. female who presents today for cervical spine.  Patient has had approximately 9 years of neck pain.  She feels it in the mid and upper cervical region.  She also notes headaches in the occipital region.  She has tried medication for migraines but has not helped with the occipital pain.  The neck pain is worse than the occipital pain.  She has no pain radiating into either upper extremity.  She notes that pain is better if she flexes the neck and is worse if she rotates or extends neck.  There was no trauma or injury that preceded the onset of the pain.   Weakness: Denies Difficulty with fine motor skills (e.g., buttoning shirts, handwriting): Denies Symptoms of imbalance: Denies Paresthesias and numbness: Yes, sometimes has numbness in the hands but it goes away if she shakes out her hands.  No other numbness or paresthesias Bowel or bladder incontinence: Denies Saddle anesthesia: Denies  Treatments tried: PT, Tylenol ,  ibuprofen  Review of systems: Denies fevers and chills, night sweats, unexplained weight loss, history of cancer.  Has had pain that wakes her at night  Past medical history: HLD Migraines  Allergies: NKDA  Past surgical history:  C-section Appendectomy  Social history: Denies use of nicotine product (smoking, vaping, patches, smokeless) Alcohol use: Denies Denies recreational drug use   Physical Exam:  BMI of 25.4  General: no acute distress, appears stated age Neurologic: alert, answering questions appropriately, following commands Respiratory: unlabored breathing on room air, symmetric chest rise Psychiatric: appropriate affect, normal cadence to speech   MSK (spine):  -Strength exam      Left  Right Grip strength                5/5  5/5 Interosseus   5/5   5/5 Wrist extension  5/5  5/5 Wrist flexion   5/5  5/5 Elbow flexion   5/5  5/5 Deltoid    5/5  5/5  -Sensory exam    Sensation intact to light touch in C5-T1 nerve distributions of bilateral upper extremities  -Brachioradialis DTR: 2/4 on the left, 2/4 on the right -Biceps DTR: 2/4 on the left, 2/4 on the right  -Spurling: Negative bilaterally -Hoffman sign: Negative bilaterally -Clonus: No beats bilaterally -Interosseous wasting: None seen -Grip and release test: Negative -Romberg: Negative -Gait: Normal  Left shoulder exam: No pain through range of motion Right shoulder exam: No pain through range of motion  Imaging: XRs of the cervical spine from 04/09/2024 were independently reviewed and interpreted, showing no significant degenerative changes.  No evidence of instability on flexion/extension views.  No fracture or dislocation  seen.   Patient name: Natalie Waller Patient MRN: [id number] Date of visit: 04/09/2024

## 2024-04-13 ENCOUNTER — Inpatient Hospital Stay: Admission: RE | Admit: 2024-04-13 | Discharge: 2024-04-13 | Payer: Self-pay | Attending: Orthopedic Surgery

## 2024-04-13 DIAGNOSIS — M542 Cervicalgia: Secondary | ICD-10-CM

## 2024-04-30 NOTE — Therapy (Incomplete)
 "  OUTPATIENT PHYSICAL THERAPY CERVICAL EVALUATION   Patient Name: Natalie Waller MRN: [id number] DOB:[birth year], 54 y.o., female Today's Date: 04/30/2024  END OF SESSION:   Past Medical History:  Diagnosis Date   Environmental and seasonal allergies 01/03/2017   Headache 2017   Migraine    Neck pain 2017   Tension headache, chronic 04/27/2015   Past Surgical History:  Procedure Laterality Date   APPENDECTOMY  2009   CESAREAN SECTION  1997   Patient Active Problem List   Diagnosis Date Noted   Itch of right eye 12/03/2022   Screening for diabetes mellitus 12/03/2022   Encounter for screening involving social determinants of health (SDoH) 12/03/2022   Ear itching 02/08/2019   Hypercholesterolemia 01/05/2018   Migraine without aura and without status migrainosus, not intractable 01/05/2018   Environmental and seasonal allergies 01/03/2017   Migraine 02/12/2016   Neck pain 04/27/2015   Tension headache, chronic 04/27/2015    PCP: Montey Presto, MD  REFERRING PROVIDER: Montey Presto, MD  REFERRING DIAG: M62.838 (ICD-10-CM) - Other muscle spasm  THERAPY DIAG:  No diagnosis found.  Rationale for Evaluation and Treatment: Rehabilitation  ONSET DATE: ***  SUBJECTIVE:                                                                                                                                                                                                         SUBJECTIVE STATEMENT: *** Hand dominance: {MISC; OT HAND DOMINANCE:[id number]}  PERTINENT HISTORY:  ***  PAIN:  Are you having pain? {OPRCPAIN:27236}  PRECAUTIONS: {Therapy precautions:24002}  RED FLAGS: {PT Red Flags:29287}     WEIGHT BEARING RESTRICTIONS: {Yes ***/No:24003}  FALLS:  Has patient fallen in last 6 months? {fallsyesno:27318}  LIVING ENVIRONMENT: Lives with: {OPRC lives with:25569::lives with their family} Lives in: {Lives in:25570} Stairs:  {opstairs:27293} Has following equipment at home: {Assistive devices:23999}  OCCUPATION: ***  PLOF: {PLOF:24004}  PATIENT GOALS: ***  NEXT MD VISIT: ***  OBJECTIVE:  Note: Objective measures were completed at Evaluation unless otherwise noted.  DIAGNOSTIC FINDINGS:  ***  PATIENT SURVEYS:  {rehab surveys:24030}  COGNITION: Overall cognitive status: {cognition:24006}  SENSATION: {sensation:27233}  POSTURE: {posture:25561}  PALPATION: ***   CERVICAL ROM:   {AROM/PROM:27142} ROM A/PROM (deg) eval  Flexion   Extension   Right lateral flexion   Left lateral flexion   Right rotation   Left rotation    (Blank rows = not tested)  UPPER EXTREMITY ROM:  {AROM/PROM:27142} ROM Right eval Left eval  Shoulder flexion    Shoulder extension    Shoulder abduction  Shoulder adduction    Shoulder extension    Shoulder internal rotation    Shoulder external rotation    Elbow flexion    Elbow extension    Wrist flexion    Wrist extension    Wrist ulnar deviation    Wrist radial deviation    Wrist pronation    Wrist supination     (Blank rows = not tested)  UPPER EXTREMITY MMT:  MMT Right eval Left eval  Shoulder flexion    Shoulder extension    Shoulder abduction    Shoulder adduction    Shoulder extension    Shoulder internal rotation    Shoulder external rotation    Middle trapezius    Lower trapezius    Elbow flexion    Elbow extension    Wrist flexion    Wrist extension    Wrist ulnar deviation    Wrist radial deviation    Wrist pronation    Wrist supination    Grip strength     (Blank rows = not tested)  CERVICAL SPECIAL TESTS:  {Cervical special tests:25246}  FUNCTIONAL TESTS:  {Functional tests:24029}  TREATMENT DATE: ***                                                                                                                                 PATIENT EDUCATION:  Education details: *** Person educated: {Person  educated:25204} Education method: {Education Method:25205} Education comprehension: {Education Comprehension:25206}  HOME EXERCISE PROGRAM: ***  ASSESSMENT:  CLINICAL IMPRESSION: Patient is a *** y.o. *** who was seen today for physical therapy evaluation and treatment for ***.   OBJECTIVE IMPAIRMENTS: {opptimpairments:25111}.   ACTIVITY LIMITATIONS: {activitylimitations:27494}  PARTICIPATION LIMITATIONS: {participationrestrictions:25113}  PERSONAL FACTORS: {Personal factors:25162} are also affecting patient's functional outcome.   REHAB POTENTIAL: {rehabpotential:25112}  CLINICAL DECISION MAKING: {clinical decision making:25114}  EVALUATION COMPLEXITY: {Evaluation complexity:25115}   GOALS: Goals reviewed with patient? {yes/no:20286}  SHORT TERM GOALS: Target date: ***  *** Baseline:  Goal status: INITIAL  2.  *** Baseline:  Goal status: INITIAL  3.  *** Baseline:  Goal status: INITIAL  4.  *** Baseline:  Goal status: INITIAL  5.  *** Baseline:  Goal status: INITIAL  6.  *** Baseline:  Goal status: INITIAL  LONG TERM GOALS: Target date: ***  *** Baseline:  Goal status: INITIAL  2.  *** Baseline:  Goal status: INITIAL  3.  *** Baseline:  Goal status: INITIAL  4.  *** Baseline:  Goal status: INITIAL  5.  *** Baseline:  Goal status: INITIAL  6.  *** Baseline:  Goal status: INITIAL   PLAN:  PT FREQUENCY: {rehab frequency:25116}  PT DURATION: {rehab duration:25117}  PLANNED INTERVENTIONS: {rehab planned interventions:25118::97110-Therapeutic exercises,97530- Therapeutic [phone]- Neuromuscular re-education,97535- Self Rjmz,02859- Manual therapy,Patient/Family education}  PLAN FOR NEXT SESSION: ***   Melita Villalona, PT 04/30/2024, 4:04 PM       "

## 2024-05-01 ENCOUNTER — Ambulatory Visit: Payer: Self-pay | Attending: Family Medicine | Admitting: Physical Therapy

## 2024-05-01 ENCOUNTER — Telehealth: Payer: Self-pay | Admitting: Physical Therapy

## 2024-05-01 NOTE — Telephone Encounter (Signed)
 Left patient a VM re no show for PT appt this morning. Patient to call us  at [phone]

## 2024-05-23 ENCOUNTER — Ambulatory Visit (INDEPENDENT_AMBULATORY_CARE_PROVIDER_SITE_OTHER): Payer: Self-pay | Admitting: Orthopedic Surgery

## 2024-05-23 DIAGNOSIS — M542 Cervicalgia: Secondary | ICD-10-CM

## 2024-05-23 NOTE — Progress Notes (Signed)
 Orthopedic Spine Surgery Office Note   Assessment: Patient is a 54 y.o. female with neck pain and occipital pain.  No radicular pain     Plan: -Patient has tried PT, Tylenol , ibuprofen -Continue with ice and heat. Can use tylenol  as needed -Talked about injection as an additional non-operative treatment if pain returns -Patient should return to office on an as needed basis     Patient expressed understanding of the plan and all questions were answered to the patient's satisfaction.    ___________________________________________________________________________     History:   Patient is a 54 y.o. female who presents today for follow up on her cervical spine.  Patient states that she continues to have neck pain and headaches in the occipital region.  She said it has improved significantly since she was last in the office.  She said she has decreased the heavy lifting that she does overhead at the gym.  She thinks that this has been helpful.  This is the only change that she has made since she was last in the office.  Patient still notes that neck pain is worse with range of motion of the neck.  She finds heat and ice helpful.     Treatments tried: PT, Tylenol , ibuprofen     Physical Exam:    General: no acute distress, appears stated age Neurologic: alert, answering questions appropriately, following commands Respiratory: unlabored breathing on room air, symmetric chest rise Psychiatric: appropriate affect, normal cadence to speech     MSK (spine):   -Strength exam                                                   Left                  Right Grip strength                5/5                  5/5 Interosseus                  5/5                  5/5 Wrist extension            5/5                  5/5 Wrist flexion                 5/5                  5/5 Elbow flexion                5/5                  5/5 Deltoid                          5/5                  5/5   -Sensory  exam                           Sensation intact to light touch in C5-T1 nerve distributions of bilateral upper extremities  Imaging: XRs of the cervical spine from 04/09/2024 were independently reviewed and interpreted, showing no significant degenerative changes.  No evidence of instability on flexion/extension views.  No fracture or dislocation seen.  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 No significant central or foraminal stenosis seen. No T2 cord signal change seen.      Patient name: Natalie Waller Patient MRN: [id number] Date of visit: 05/23/24  This entire visit was performed with the aid of an in-person interpreter
